# Patient Record
Sex: Female | Born: 1945 | Race: White | Hispanic: Yes | Smoking: Former smoker
Health system: Southern US, Community
[De-identification: ages and names within clinical notes are randomized; demographics above are authoritative.]

## PROBLEM LIST (undated history)

## (undated) DIAGNOSIS — G510 Bell's palsy: Secondary | ICD-10-CM

## (undated) DIAGNOSIS — R351 Nocturia: Secondary | ICD-10-CM

## (undated) DIAGNOSIS — Z860100 Personal history of colon polyps, unspecified: Secondary | ICD-10-CM

## (undated) DIAGNOSIS — H269 Unspecified cataract: Secondary | ICD-10-CM

## (undated) DIAGNOSIS — Z9889 Other specified postprocedural states: Secondary | ICD-10-CM

## (undated) DIAGNOSIS — I341 Nonrheumatic mitral (valve) prolapse: Secondary | ICD-10-CM

## (undated) DIAGNOSIS — I10 Essential (primary) hypertension: Secondary | ICD-10-CM

## (undated) DIAGNOSIS — A6920 Lyme disease, unspecified: Secondary | ICD-10-CM

## (undated) DIAGNOSIS — K219 Gastro-esophageal reflux disease without esophagitis: Secondary | ICD-10-CM

## (undated) DIAGNOSIS — R112 Nausea with vomiting, unspecified: Secondary | ICD-10-CM

## (undated) DIAGNOSIS — G4700 Insomnia, unspecified: Secondary | ICD-10-CM

## (undated) DIAGNOSIS — E785 Hyperlipidemia, unspecified: Secondary | ICD-10-CM

## (undated) DIAGNOSIS — R0982 Postnasal drip: Secondary | ICD-10-CM

## (undated) HISTORY — PX: SHOULDER ARTHROSCOPY: SHX128

## (undated) HISTORY — PX: DILATION AND CURETTAGE OF UTERUS: SHX78

## (undated) HISTORY — PX: TONSILLECTOMY: SUR1361

## (undated) HISTORY — PX: CERVICAL CONE BIOPSY: SUR198

## (undated) HISTORY — PX: COLONOSCOPY: SHX174

---
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
## 1999-01-31 ENCOUNTER — Other Ambulatory Visit: Admission: RE | Admit: 1999-01-31 | Discharge: 1999-01-31 | Payer: Self-pay | Admitting: Family Medicine

## 1999-08-05 ENCOUNTER — Encounter: Payer: Self-pay | Admitting: Family Medicine

## 1999-08-05 ENCOUNTER — Ambulatory Visit (HOSPITAL_COMMUNITY): Admission: RE | Admit: 1999-08-05 | Discharge: 1999-08-05 | Payer: Self-pay | Admitting: Family Medicine

## 1999-09-11 ENCOUNTER — Encounter (INDEPENDENT_AMBULATORY_CARE_PROVIDER_SITE_OTHER): Payer: Self-pay

## 1999-09-11 ENCOUNTER — Other Ambulatory Visit: Admission: RE | Admit: 1999-09-11 | Discharge: 1999-09-11 | Payer: Self-pay | Admitting: Obstetrics and Gynecology

## 2007-02-15 ENCOUNTER — Other Ambulatory Visit: Admission: RE | Admit: 2007-02-15 | Discharge: 2007-02-15 | Payer: Self-pay | Admitting: Family Medicine

## 2008-06-09 ENCOUNTER — Emergency Department (HOSPITAL_COMMUNITY): Admission: EM | Admit: 2008-06-09 | Discharge: 2008-06-10 | Payer: Self-pay | Admitting: Emergency Medicine

## 2009-10-08 ENCOUNTER — Encounter: Admission: RE | Admit: 2009-10-08 | Discharge: 2009-10-08 | Payer: Self-pay | Admitting: Family Medicine

## 2010-02-01 ENCOUNTER — Other Ambulatory Visit: Payer: Self-pay | Admitting: Family Medicine

## 2010-02-01 DIAGNOSIS — Z9289 Personal history of other medical treatment: Secondary | ICD-10-CM

## 2010-04-14 ENCOUNTER — Ambulatory Visit
Admission: RE | Admit: 2010-04-14 | Discharge: 2010-04-14 | Disposition: A | Discharge status: Home / Self Care | Payer: 59 | Source: Ambulatory Visit | Attending: Family Medicine | Admitting: Family Medicine

## 2010-04-14 DIAGNOSIS — Z9289 Personal history of other medical treatment: Secondary | ICD-10-CM

## 2010-04-22 LAB — POCT I-STAT, CHEM 8
BUN: 20 mg/dL (ref 6–23)
Calcium, Ion: 1.02 mmol/L — ABNORMAL LOW (ref 1.12–1.32)
Chloride: 105 mEq/L (ref 96–112)
Creatinine, Ser: 0.7 mg/dL (ref 0.4–1.2)
Glucose, Bld: 106 mg/dL — ABNORMAL HIGH (ref 70–99)
HCT: 42 % (ref 36.0–46.0)
Hemoglobin: 14.3 g/dL (ref 12.0–15.0)
Potassium: 3.4 mEq/L — ABNORMAL LOW (ref 3.5–5.1)
Sodium: 137 mEq/L (ref 135–145)
TCO2: 23 mmol/L (ref 0–100)

## 2010-05-22 ENCOUNTER — Other Ambulatory Visit (HOSPITAL_COMMUNITY)
Admission: RE | Admit: 2010-05-22 | Discharge: 2010-05-22 | Disposition: A | Discharge status: Home / Self Care | Payer: 59 | Source: Ambulatory Visit | Attending: Family Medicine | Admitting: Family Medicine

## 2010-05-22 ENCOUNTER — Other Ambulatory Visit: Payer: Self-pay | Admitting: Family Medicine

## 2010-05-22 DIAGNOSIS — Z124 Encounter for screening for malignant neoplasm of cervix: Secondary | ICD-10-CM | POA: Insufficient documentation

## 2011-04-10 ENCOUNTER — Other Ambulatory Visit: Payer: Self-pay | Admitting: Family Medicine

## 2011-04-10 DIAGNOSIS — Z1231 Encounter for screening mammogram for malignant neoplasm of breast: Secondary | ICD-10-CM

## 2011-04-17 ENCOUNTER — Ambulatory Visit
Admission: RE | Admit: 2011-04-17 | Discharge: 2011-04-17 | Disposition: A | Discharge status: Home / Self Care | Payer: 59 | Source: Ambulatory Visit | Attending: Family Medicine | Admitting: Family Medicine

## 2011-04-17 DIAGNOSIS — Z1231 Encounter for screening mammogram for malignant neoplasm of breast: Secondary | ICD-10-CM

## 2012-04-22 ENCOUNTER — Other Ambulatory Visit: Payer: Self-pay

## 2012-04-22 ENCOUNTER — Other Ambulatory Visit: Payer: Self-pay | Admitting: Family Medicine

## 2012-04-22 DIAGNOSIS — M8580 Other specified disorders of bone density and structure, unspecified site: Secondary | ICD-10-CM

## 2012-04-22 DIAGNOSIS — Z1231 Encounter for screening mammogram for malignant neoplasm of breast: Secondary | ICD-10-CM

## 2012-05-25 ENCOUNTER — Ambulatory Visit
Admission: RE | Admit: 2012-05-25 | Discharge: 2012-05-25 | Disposition: A | Discharge status: Home / Self Care | Payer: Medicare Other | Source: Ambulatory Visit | Attending: Family Medicine | Admitting: Family Medicine

## 2012-05-25 ENCOUNTER — Ambulatory Visit
Admission: RE | Admit: 2012-05-25 | Discharge: 2012-05-25 | Disposition: A | Discharge status: Home / Self Care | Payer: Medicare Other | Source: Ambulatory Visit

## 2012-05-25 DIAGNOSIS — M8580 Other specified disorders of bone density and structure, unspecified site: Secondary | ICD-10-CM

## 2012-05-25 DIAGNOSIS — Z1231 Encounter for screening mammogram for malignant neoplasm of breast: Secondary | ICD-10-CM

## 2013-05-11 ENCOUNTER — Other Ambulatory Visit: Payer: Self-pay

## 2013-05-11 DIAGNOSIS — Z1231 Encounter for screening mammogram for malignant neoplasm of breast: Secondary | ICD-10-CM

## 2013-05-31 ENCOUNTER — Encounter (INDEPENDENT_AMBULATORY_CARE_PROVIDER_SITE_OTHER): Payer: Self-pay

## 2013-05-31 ENCOUNTER — Ambulatory Visit
Admission: RE | Admit: 2013-05-31 | Discharge: 2013-05-31 | Disposition: A | Discharge status: Home / Self Care | Payer: Commercial Managed Care - HMO | Source: Ambulatory Visit

## 2013-05-31 DIAGNOSIS — Z1231 Encounter for screening mammogram for malignant neoplasm of breast: Secondary | ICD-10-CM

## 2013-06-06 ENCOUNTER — Other Ambulatory Visit: Payer: Self-pay | Admitting: Gastroenterology

## 2013-09-06 ENCOUNTER — Other Ambulatory Visit (HOSPITAL_COMMUNITY)
Admission: RE | Admit: 2013-09-06 | Discharge: 2013-09-06 | Disposition: A | Discharge status: Home / Self Care | Payer: Medicare PPO | Source: Ambulatory Visit | Attending: Family Medicine | Admitting: Family Medicine

## 2013-09-06 ENCOUNTER — Other Ambulatory Visit: Payer: Self-pay | Admitting: Family Medicine

## 2013-09-06 DIAGNOSIS — Z124 Encounter for screening for malignant neoplasm of cervix: Secondary | ICD-10-CM | POA: Diagnosis present

## 2013-09-07 LAB — CYTOLOGY - PAP

## 2014-04-24 DIAGNOSIS — H00022 Hordeolum internum right lower eyelid: Secondary | ICD-10-CM | POA: Diagnosis not present

## 2014-05-16 DIAGNOSIS — H35033 Hypertensive retinopathy, bilateral: Secondary | ICD-10-CM | POA: Diagnosis not present

## 2014-05-16 DIAGNOSIS — H524 Presbyopia: Secondary | ICD-10-CM | POA: Diagnosis not present

## 2014-05-16 DIAGNOSIS — H5210 Myopia, unspecified eye: Secondary | ICD-10-CM | POA: Diagnosis not present

## 2014-06-05 ENCOUNTER — Other Ambulatory Visit: Payer: Self-pay

## 2014-06-05 DIAGNOSIS — Z1231 Encounter for screening mammogram for malignant neoplasm of breast: Secondary | ICD-10-CM

## 2014-07-03 ENCOUNTER — Ambulatory Visit
Admission: RE | Admit: 2014-07-03 | Discharge: 2014-07-03 | Disposition: A | Discharge status: Home / Self Care | Payer: Commercial Managed Care - HMO | Source: Ambulatory Visit

## 2014-07-03 ENCOUNTER — Encounter (INDEPENDENT_AMBULATORY_CARE_PROVIDER_SITE_OTHER): Payer: Self-pay

## 2014-07-03 DIAGNOSIS — Z1231 Encounter for screening mammogram for malignant neoplasm of breast: Secondary | ICD-10-CM

## 2014-09-10 DIAGNOSIS — I10 Essential (primary) hypertension: Secondary | ICD-10-CM | POA: Diagnosis not present

## 2014-09-10 DIAGNOSIS — Z23 Encounter for immunization: Secondary | ICD-10-CM | POA: Diagnosis not present

## 2014-09-10 DIAGNOSIS — K219 Gastro-esophageal reflux disease without esophagitis: Secondary | ICD-10-CM | POA: Diagnosis not present

## 2014-09-10 DIAGNOSIS — Z0000 Encounter for general adult medical examination without abnormal findings: Secondary | ICD-10-CM | POA: Diagnosis not present

## 2014-09-10 DIAGNOSIS — R7301 Impaired fasting glucose: Secondary | ICD-10-CM | POA: Diagnosis not present

## 2014-09-10 DIAGNOSIS — E782 Mixed hyperlipidemia: Secondary | ICD-10-CM | POA: Diagnosis not present

## 2014-09-10 DIAGNOSIS — E559 Vitamin D deficiency, unspecified: Secondary | ICD-10-CM | POA: Diagnosis not present

## 2014-09-10 DIAGNOSIS — J309 Allergic rhinitis, unspecified: Secondary | ICD-10-CM | POA: Diagnosis not present

## 2014-10-17 DIAGNOSIS — R7301 Impaired fasting glucose: Secondary | ICD-10-CM | POA: Diagnosis not present

## 2014-10-17 DIAGNOSIS — J309 Allergic rhinitis, unspecified: Secondary | ICD-10-CM | POA: Diagnosis not present

## 2014-10-17 DIAGNOSIS — K219 Gastro-esophageal reflux disease without esophagitis: Secondary | ICD-10-CM | POA: Diagnosis not present

## 2014-10-17 DIAGNOSIS — M85852 Other specified disorders of bone density and structure, left thigh: Secondary | ICD-10-CM | POA: Diagnosis not present

## 2014-10-17 DIAGNOSIS — E782 Mixed hyperlipidemia: Secondary | ICD-10-CM | POA: Diagnosis not present

## 2014-10-17 DIAGNOSIS — Z23 Encounter for immunization: Secondary | ICD-10-CM | POA: Diagnosis not present

## 2014-10-17 DIAGNOSIS — I10 Essential (primary) hypertension: Secondary | ICD-10-CM | POA: Diagnosis not present

## 2014-11-03 ENCOUNTER — Other Ambulatory Visit: Payer: Self-pay | Admitting: Family Medicine

## 2014-11-03 DIAGNOSIS — M8580 Other specified disorders of bone density and structure, unspecified site: Secondary | ICD-10-CM

## 2014-11-16 DIAGNOSIS — S0501XA Injury of conjunctiva and corneal abrasion without foreign body, right eye, initial encounter: Secondary | ICD-10-CM | POA: Diagnosis not present

## 2014-12-05 ENCOUNTER — Inpatient Hospital Stay: Admission: RE | Admit: 2014-12-05 | Payer: Commercial Managed Care - HMO | Source: Ambulatory Visit

## 2014-12-18 DIAGNOSIS — H34832 Tributary (branch) retinal vein occlusion, left eye, with macular edema: Secondary | ICD-10-CM | POA: Diagnosis not present

## 2014-12-20 ENCOUNTER — Encounter (INDEPENDENT_AMBULATORY_CARE_PROVIDER_SITE_OTHER): Payer: Commercial Managed Care - HMO | Admitting: Ophthalmology

## 2014-12-20 DIAGNOSIS — I10 Essential (primary) hypertension: Secondary | ICD-10-CM | POA: Diagnosis not present

## 2014-12-20 DIAGNOSIS — H4312 Vitreous hemorrhage, left eye: Secondary | ICD-10-CM | POA: Diagnosis not present

## 2014-12-20 DIAGNOSIS — H2513 Age-related nuclear cataract, bilateral: Secondary | ICD-10-CM

## 2014-12-20 DIAGNOSIS — H35033 Hypertensive retinopathy, bilateral: Secondary | ICD-10-CM

## 2014-12-20 DIAGNOSIS — H43813 Vitreous degeneration, bilateral: Secondary | ICD-10-CM

## 2014-12-20 NOTE — H&P (Signed)
Alicia [name] is an 69 y.o. female.   Chief Complaint: Sudden loss of vision left eye HPI: Vitreous hemorrhage, hypertension.  No past medical history on file.  No past surgical history on file.  No family history on file. Social History:  has no tobacco, alcohol, and drug history on file.  Allergies: Not on File  No prescriptions prior to admission    Review of systems otherwise negative  There were no vitals taken for this visit.  Physical exam: Mental status: oriented x3. Eyes: See eye exam associated with this date of surgery in media tab.  Scanned in by scanning center Ears, Nose, Throat: within normal limits Neck: Within Normal limits General: within normal limits Chest: Within normal limits Breast: deferred Heart: Within normal limits Abdomen: Within normal limits GU: deferred Extremities: within normal limits Skin: within normal limits  Assessment/Plan Vitreous hemorrhage left eye [facility]a [facility], laser, gas left eye  Possible scleral buckle.  Sherrie GeorgeMATTHEWS, Adrine Hayworth D 12/20/2014, 4:12 PM

## 2014-12-24 ENCOUNTER — Encounter (HOSPITAL_COMMUNITY): Payer: Self-pay | Admitting: *Deleted

## 2014-12-24 DIAGNOSIS — H3562 Retinal hemorrhage, left eye: Secondary | ICD-10-CM | POA: Diagnosis not present

## 2014-12-24 DIAGNOSIS — I10 Essential (primary) hypertension: Secondary | ICD-10-CM | POA: Diagnosis not present

## 2014-12-24 MED ORDER — CEFAZOLIN SODIUM-DEXTROSE 2-3 GM-% IV SOLR
2.0000 g | INTRAVENOUS | Status: AC
  Administered 2014-12-25: 2 g via INTRAVENOUS
  Filled 2014-12-24: qty 50

## 2014-12-25 ENCOUNTER — Ambulatory Visit (HOSPITAL_COMMUNITY)
Admission: RE | Admit: 2014-12-25 | Discharge: 2014-12-26 | Disposition: A | Discharge status: Home / Self Care | Payer: Commercial Managed Care - HMO | Source: Ambulatory Visit | Attending: Ophthalmology | Admitting: Ophthalmology

## 2014-12-25 ENCOUNTER — Encounter (HOSPITAL_COMMUNITY): Payer: Self-pay | Admitting: *Deleted

## 2014-12-25 ENCOUNTER — Ambulatory Visit (HOSPITAL_COMMUNITY): Payer: Commercial Managed Care - HMO | Admitting: Certified Registered Nurse Anesthetist

## 2014-12-25 ENCOUNTER — Encounter (HOSPITAL_COMMUNITY)
Admission: RE | Disposition: A | Discharge status: Home / Self Care | Payer: Self-pay | Source: Ambulatory Visit | Attending: Ophthalmology

## 2014-12-25 DIAGNOSIS — Z881 Allergy status to other antibiotic agents status: Secondary | ICD-10-CM | POA: Diagnosis not present

## 2014-12-25 DIAGNOSIS — K219 Gastro-esophageal reflux disease without esophagitis: Secondary | ICD-10-CM | POA: Insufficient documentation

## 2014-12-25 DIAGNOSIS — H4312 Vitreous hemorrhage, left eye: Secondary | ICD-10-CM | POA: Diagnosis present

## 2014-12-25 DIAGNOSIS — Z885 Allergy status to narcotic agent status: Secondary | ICD-10-CM | POA: Diagnosis not present

## 2014-12-25 DIAGNOSIS — I10 Essential (primary) hypertension: Secondary | ICD-10-CM | POA: Diagnosis not present

## 2014-12-25 DIAGNOSIS — Z87891 Personal history of nicotine dependence: Secondary | ICD-10-CM | POA: Diagnosis not present

## 2014-12-25 HISTORY — DX: Nonrheumatic mitral (valve) prolapse: I34.1

## 2014-12-25 HISTORY — DX: Postnasal drip: R09.82

## 2014-12-25 HISTORY — DX: Unspecified cataract: H26.9

## 2014-12-25 HISTORY — DX: Bell's palsy: G51.0

## 2014-12-25 HISTORY — DX: Essential (primary) hypertension: I10

## 2014-12-25 HISTORY — PX: PARS PLANA VITRECTOMY: SHX2166

## 2014-12-25 HISTORY — DX: Lyme disease, unspecified: A69.20

## 2014-12-25 HISTORY — DX: Other specified postprocedural states: Z98.890

## 2014-12-25 HISTORY — PX: GAS/FLUID EXCHANGE: SHX5334

## 2014-12-25 HISTORY — DX: Insomnia, unspecified: G47.00

## 2014-12-25 HISTORY — DX: Personal history of colon polyps, unspecified: Z86.0100

## 2014-12-25 HISTORY — DX: Nausea with vomiting, unspecified: R11.2

## 2014-12-25 HISTORY — DX: Nocturia: R35.1

## 2014-12-25 HISTORY — DX: Hyperlipidemia, unspecified: E78.5

## 2014-12-25 HISTORY — DX: Personal history of colonic polyps: Z86.010

## 2014-12-25 HISTORY — DX: Gastro-esophageal reflux disease without esophagitis: K21.9

## 2014-12-25 LAB — BASIC METABOLIC PANEL
Anion gap: 9 (ref 5–15)
BUN: 23 mg/dL — AB (ref 6–20)
CALCIUM: 9.6 mg/dL (ref 8.9–10.3)
CO2: 26 mmol/L (ref 22–32)
Chloride: 102 mmol/L (ref 101–111)
Creatinine, Ser: 0.8 mg/dL (ref 0.44–1.00)
GFR calc Af Amer: >60 mL/min (ref >60)
Glucose, Bld: 114 mg/dL — ABNORMAL HIGH (ref 65–99)
Potassium: 4.2 mmol/L (ref 3.5–5.1)
Sodium: 137 mmol/L (ref 135–145)

## 2014-12-25 LAB — CBC
HCT: 36.6 % (ref 36.0–46.0)
Hemoglobin: 11.9 g/dL — ABNORMAL LOW (ref 12.0–15.0)
MCH: 31.1 pg (ref 26.0–34.0)
MCHC: 32.5 g/dL (ref 30.0–36.0)
MCV: 95.6 fL (ref 78.0–100.0)
PLATELETS: 250 10*3/uL (ref 150–400)
RBC: 3.83 MIL/uL — ABNORMAL LOW (ref 3.87–5.11)
RDW: 13.1 % (ref 11.5–15.5)
WBC: 4.4 10*3/uL (ref 4.0–10.5)

## 2014-12-25 SURGERY — PARS PLANA VITRECTOMY WITH 25 GAUGE
Anesthesia: General | Site: Eye | Laterality: Left

## 2014-12-25 MED ORDER — ONDANSETRON HCL 4 MG/2ML IJ SOLN
4.0000 mg | Freq: Four times a day (QID) | INTRAMUSCULAR | Status: DC
  Administered 2014-12-25 – 2014-12-26 (×2): 4 mg via INTRAVENOUS
  Filled 2014-12-25 (×2): qty 2

## 2014-12-25 MED ORDER — HYDROCODONE-ACETAMINOPHEN 5-325 MG PO TABS: 1.0000 | ORAL_TABLET | ORAL | Status: DC | PRN

## 2014-12-25 MED ORDER — BRIMONIDINE TARTRATE 0.2 % OP SOLN
1.0000 [drp] | Freq: Two times a day (BID) | OPHTHALMIC | Status: DC
  Filled 2014-12-25: qty 5

## 2014-12-25 MED ORDER — HEMOSTATIC AGENTS (NO CHARGE) OPTIME
TOPICAL | Status: DC | PRN
  Administered 2014-12-25: 1 via TOPICAL

## 2014-12-25 MED ORDER — MIDAZOLAM HCL 2 MG/2ML IJ SOLN
INTRAMUSCULAR | Status: AC
  Filled 2014-12-25: qty 2

## 2014-12-25 MED ORDER — HYDROMORPHONE HCL 1 MG/ML IJ SOLN
0.2500 mg | INTRAMUSCULAR | Status: DC | PRN
  Administered 2014-12-25 (×2): 0.5 mg via INTRAVENOUS

## 2014-12-25 MED ORDER — PREDNISOLONE ACETATE 1 % OP SUSP
1.0000 [drp] | Freq: Four times a day (QID) | OPHTHALMIC | Status: DC
  Filled 2014-12-25: qty 5

## 2014-12-25 MED ORDER — PHENYLEPHRINE HCL 10 MG/ML IJ SOLN
INTRAMUSCULAR | Status: DC | PRN
  Administered 2014-12-25 (×4): 40 ug via INTRAVENOUS

## 2014-12-25 MED ORDER — HYDROCHLOROTHIAZIDE 12.5 MG PO CAPS: 12.5000 mg | ORAL_CAPSULE | Freq: Every day | ORAL | Status: DC

## 2014-12-25 MED ORDER — TETRACAINE HCL 0.5 % OP SOLN
2.0000 [drp] | Freq: Once | OPHTHALMIC | Status: DC
  Filled 2014-12-25: qty 2

## 2014-12-25 MED ORDER — TEMAZEPAM 15 MG PO CAPS
15.0000 mg | ORAL_CAPSULE | Freq: Every evening | ORAL | Status: DC | PRN
  Administered 2014-12-26: 15 mg via ORAL
  Filled 2014-12-25: qty 1

## 2014-12-25 MED ORDER — PHENYLEPHRINE HCL 10 MG/ML IJ SOLN
10.0000 mg | INTRAVENOUS | Status: DC | PRN
  Administered 2014-12-25: 25 ug/min via INTRAVENOUS

## 2014-12-25 MED ORDER — SODIUM CHLORIDE 0.9 % IJ SOLN
INTRAMUSCULAR | Status: AC
  Filled 2014-12-25: qty 10

## 2014-12-25 MED ORDER — HYDROMORPHONE HCL 1 MG/ML IJ SOLN
INTRAMUSCULAR | Status: AC
  Filled 2014-12-25: qty 1

## 2014-12-25 MED ORDER — EPINEPHRINE HCL 1 MG/ML IJ SOLN
INTRAOCULAR | Status: DC | PRN
  Administered 2014-12-25: 13:00:00

## 2014-12-25 MED ORDER — SODIUM CHLORIDE 0.45 % IV SOLN
INTRAVENOUS | Status: DC
  Administered 2014-12-25: 19:00:00 via INTRAVENOUS

## 2014-12-25 MED ORDER — BUPIVACAINE HCL (PF) 0.75 % IJ SOLN
INTRAMUSCULAR | Status: AC
  Filled 2014-12-25: qty 10

## 2014-12-25 MED ORDER — DEXAMETHASONE SODIUM PHOSPHATE 10 MG/ML IJ SOLN
INTRAMUSCULAR | Status: AC
  Filled 2014-12-25: qty 1

## 2014-12-25 MED ORDER — HYPROMELLOSE (GONIOSCOPIC) 2.5 % OP SOLN
OPHTHALMIC | Status: AC
  Filled 2014-12-25: qty 15

## 2014-12-25 MED ORDER — IBUPROFEN 400 MG PO TABS
400.0000 mg | ORAL_TABLET | Freq: Four times a day (QID) | ORAL | Status: DC | PRN
  Filled 2014-12-25: qty 1

## 2014-12-25 MED ORDER — SODIUM CHLORIDE 0.9 % IV SOLN
INTRAVENOUS | Status: DC
  Administered 2014-12-25 (×2): via INTRAVENOUS

## 2014-12-25 MED ORDER — BSS IO SOLN
INTRAOCULAR | Status: AC
  Filled 2014-12-25: qty 15

## 2014-12-25 MED ORDER — LISINOPRIL 20 MG PO TABS: 20.0000 mg | ORAL_TABLET | Freq: Every day | ORAL | Status: DC

## 2014-12-25 MED ORDER — SODIUM HYALURONATE 10 MG/ML IO SOLN
INTRAOCULAR | Status: AC
  Filled 2014-12-25: qty 0.85

## 2014-12-25 MED ORDER — BACITRACIN-POLYMYXIN B 500-10000 UNIT/GM OP OINT
TOPICAL_OINTMENT | OPHTHALMIC | Status: AC
  Filled 2014-12-25: qty 3.5

## 2014-12-25 MED ORDER — GATIFLOXACIN 0.5 % OP SOLN
1.0000 [drp] | OPHTHALMIC | Status: AC | PRN
  Administered 2014-12-25 (×3): 1 [drp] via OPHTHALMIC
  Filled 2014-12-25: qty 2.5

## 2014-12-25 MED ORDER — ACETAZOLAMIDE SODIUM 500 MG IJ SOLR
500.0000 mg | Freq: Once | INTRAMUSCULAR | Status: AC
  Administered 2014-12-26: 500 mg via INTRAVENOUS
  Filled 2014-12-25: qty 500

## 2014-12-25 MED ORDER — CYCLOPENTOLATE HCL 1 % OP SOLN
1.0000 [drp] | OPHTHALMIC | Status: AC | PRN
  Administered 2014-12-25 (×3): 1 [drp] via OPHTHALMIC
  Filled 2014-12-25: qty 2

## 2014-12-25 MED ORDER — ROCURONIUM BROMIDE 100 MG/10ML IV SOLN
INTRAVENOUS | Status: DC | PRN
  Administered 2014-12-25: 40 mg via INTRAVENOUS

## 2014-12-25 MED ORDER — FENTANYL CITRATE (PF) 100 MCG/2ML IJ SOLN
INTRAMUSCULAR | Status: DC | PRN
  Administered 2014-12-25: 50 ug via INTRAVENOUS

## 2014-12-25 MED ORDER — FENTANYL CITRATE (PF) 250 MCG/5ML IJ SOLN
INTRAMUSCULAR | Status: AC
  Filled 2014-12-25: qty 5

## 2014-12-25 MED ORDER — PROMETHAZINE HCL 25 MG/ML IJ SOLN
12.5000 mg | Freq: Four times a day (QID) | INTRAMUSCULAR | Status: DC | PRN
  Filled 2014-12-25: qty 1

## 2014-12-25 MED ORDER — EPINEPHRINE HCL 1 MG/ML IJ SOLN
INTRAMUSCULAR | Status: AC
  Filled 2014-12-25: qty 1

## 2014-12-25 MED ORDER — DORZOLAMIDE HCL-TIMOLOL MAL 2-0.5 % OP SOLN
1.0000 [drp] | Freq: Once | OPHTHALMIC | Status: AC
  Administered 2014-12-25: 1 [drp] via OPHTHALMIC
  Filled 2014-12-25: qty 10

## 2014-12-25 MED ORDER — DORZOLAMIDE HCL-TIMOLOL MAL 2-0.5 % OP SOLN: 1.0000 [drp] | Freq: Two times a day (BID) | OPHTHALMIC | Status: DC

## 2014-12-25 MED ORDER — MEPERIDINE HCL 25 MG/ML IJ SOLN: 6.2500 mg | INTRAMUSCULAR | Status: DC | PRN

## 2014-12-25 MED ORDER — PROPOFOL 10 MG/ML IV BOLUS
INTRAVENOUS | Status: DC | PRN
  Administered 2014-12-25: 100 mg via INTRAVENOUS

## 2014-12-25 MED ORDER — BACITRACIN-POLYMYXIN B 500-10000 UNIT/GM OP OINT
1.0000 "application " | TOPICAL_OINTMENT | Freq: Three times a day (TID) | OPHTHALMIC | Status: DC
  Filled 2014-12-25: qty 3.5

## 2014-12-25 MED ORDER — FLUTICASONE PROPIONATE 50 MCG/ACT NA SUSP
1.0000 | Freq: Every day | NASAL | Status: DC | PRN
  Filled 2014-12-25: qty 16

## 2014-12-25 MED ORDER — GATIFLOXACIN 0.5 % OP SOLN
1.0000 [drp] | Freq: Four times a day (QID) | OPHTHALMIC | Status: DC
  Filled 2014-12-25: qty 2.5

## 2014-12-25 MED ORDER — ACETAMINOPHEN 325 MG PO TABS: 325.0000 mg | ORAL_TABLET | ORAL | Status: DC | PRN

## 2014-12-25 MED ORDER — ONDANSETRON HCL 4 MG/2ML IJ SOLN
INTRAMUSCULAR | Status: DC | PRN
  Administered 2014-12-25: 4 mg via INTRAVENOUS

## 2014-12-25 MED ORDER — STERILE WATER FOR INJECTION IJ SOLN
INTRAMUSCULAR | Status: AC
  Filled 2014-12-25: qty 20

## 2014-12-25 MED ORDER — MIDAZOLAM HCL 5 MG/5ML IJ SOLN
INTRAMUSCULAR | Status: DC | PRN
  Administered 2014-12-25: 1 mg via INTRAVENOUS

## 2014-12-25 MED ORDER — BSS PLUS IO SOLN
INTRAOCULAR | Status: AC
  Filled 2014-12-25: qty 500

## 2014-12-25 MED ORDER — LATANOPROST 0.005 % OP SOLN
1.0000 [drp] | Freq: Every day | OPHTHALMIC | Status: DC
  Filled 2014-12-25: qty 2.5

## 2014-12-25 MED ORDER — POLYMYXIN B SULFATE 500000 UNITS IJ SOLR
INTRAMUSCULAR | Status: AC
  Filled 2014-12-25: qty 1

## 2014-12-25 MED ORDER — 0.9 % SODIUM CHLORIDE (POUR BTL) OPTIME
TOPICAL | Status: DC | PRN
Start: 2014-12-25 — End: 2014-12-25
  Administered 2014-12-25: 200 mL

## 2014-12-25 MED ORDER — SODIUM HYALURONATE 10 MG/ML IO SOLN
INTRAOCULAR | Status: DC | PRN
  Administered 2014-12-25: 0.85 mL via INTRAOCULAR

## 2014-12-25 MED ORDER — HYALURONIDASE HUMAN 150 UNIT/ML IJ SOLN
INTRAMUSCULAR | Status: AC
  Filled 2014-12-25: qty 1

## 2014-12-25 MED ORDER — PANTOPRAZOLE SODIUM 40 MG PO TBEC: 40.0000 mg | DELAYED_RELEASE_TABLET | Freq: Every day | ORAL | Status: DC

## 2014-12-25 MED ORDER — STERILE WATER FOR INJECTION IJ SOLN
INTRAMUSCULAR | Status: DC | PRN
  Administered 2014-12-25: 20 mL

## 2014-12-25 MED ORDER — MAGNESIUM HYDROXIDE 400 MG/5ML PO SUSP: 15.0000 mL | Freq: Four times a day (QID) | ORAL | Status: DC | PRN

## 2014-12-25 MED ORDER — CEFTAZIDIME 1 G IJ SOLR
INTRAMUSCULAR | Status: AC
  Filled 2014-12-25: qty 1

## 2014-12-25 MED ORDER — GLYCOPYRROLATE 0.2 MG/ML IJ SOLN
INTRAMUSCULAR | Status: DC | PRN
  Administered 2014-12-25: 0.4 mg via INTRAVENOUS

## 2014-12-25 MED ORDER — PHENYLEPHRINE HCL 2.5 % OP SOLN
1.0000 [drp] | OPHTHALMIC | Status: AC | PRN
Start: 2014-12-25 — End: 2014-12-25
  Administered 2014-12-25 (×3): 1 [drp] via OPHTHALMIC
  Filled 2014-12-25: qty 2

## 2014-12-25 MED ORDER — DEXAMETHASONE SODIUM PHOSPHATE 10 MG/ML IJ SOLN
INTRAMUSCULAR | Status: DC | PRN
  Administered 2014-12-25: 10 mg

## 2014-12-25 MED ORDER — NEOSTIGMINE METHYLSULFATE 10 MG/10ML IV SOLN
INTRAVENOUS | Status: DC | PRN
  Administered 2014-12-25: 3 mg via INTRAVENOUS

## 2014-12-25 MED ORDER — BACITRACIN-POLYMYXIN B 500-10000 UNIT/GM OP OINT
TOPICAL_OINTMENT | OPHTHALMIC | Status: DC | PRN
  Administered 2014-12-25: 1 via OPHTHALMIC

## 2014-12-25 MED ORDER — MORPHINE SULFATE (PF) 2 MG/ML IV SOLN: 1.0000 mg | INTRAVENOUS | Status: DC | PRN

## 2014-12-25 MED ORDER — SODIUM CHLORIDE 0.9 % IV SOLN: INTRAVENOUS | Status: DC

## 2014-12-25 MED ORDER — LISINOPRIL-HYDROCHLOROTHIAZIDE 20-12.5 MG PO TABS: 1.0000 | ORAL_TABLET | Freq: Every day | ORAL | Status: DC

## 2014-12-25 MED ORDER — BUPIVACAINE HCL (PF) 0.75 % IJ SOLN
INTRAMUSCULAR | Status: DC | PRN
  Administered 2014-12-25: 10 mL

## 2014-12-25 MED ORDER — DILTIAZEM HCL ER COATED BEADS 180 MG PO CP24: 180.0000 mg | ORAL_CAPSULE | Freq: Every day | ORAL | Status: DC

## 2014-12-25 MED ORDER — TROPICAMIDE 1 % OP SOLN
1.0000 [drp] | OPHTHALMIC | Status: AC | PRN
  Administered 2014-12-25 (×3): 1 [drp] via OPHTHALMIC
  Filled 2014-12-25: qty 3

## 2014-12-25 MED ORDER — STERILE WATER FOR IRRIGATION IR SOLN
Status: DC | PRN
  Administered 2014-12-25: 200 mL

## 2014-12-25 MED ORDER — ATROPINE SULFATE 1 % OP SOLN
OPHTHALMIC | Status: AC
  Filled 2014-12-25: qty 5

## 2014-12-25 MED ORDER — ARTIFICIAL TEARS OP OINT
TOPICAL_OINTMENT | OPHTHALMIC | Status: DC | PRN
  Administered 2014-12-25: 1 via OPHTHALMIC

## 2014-12-25 MED ORDER — ONDANSETRON HCL 4 MG/2ML IJ SOLN
4.0000 mg | Freq: Once | INTRAMUSCULAR | Status: AC | PRN
  Administered 2014-12-25: 4 mg via INTRAVENOUS

## 2014-12-25 MED ORDER — PROPOFOL 10 MG/ML IV BOLUS
INTRAVENOUS | Status: AC
  Filled 2014-12-25: qty 20

## 2014-12-25 MED ORDER — ONDANSETRON HCL 4 MG/2ML IJ SOLN
INTRAMUSCULAR | Status: AC
  Filled 2014-12-25: qty 2

## 2014-12-25 MED ORDER — TRIAMCINOLONE ACETONIDE 40 MG/ML IJ SUSP
INTRAMUSCULAR | Status: AC
  Filled 2014-12-25: qty 5

## 2014-12-25 SURGICAL SUPPLY — 64 items
APPLICATOR DR MATTHEWS STRL (MISCELLANEOUS) IMPLANT
BLADE EYE CATARACT 19 1.4 BEAV (BLADE) IMPLANT
BLADE MVR KNIFE 19G (BLADE) IMPLANT
BLADE MVR KNIFE 20G (BLADE) IMPLANT
CANNULA DUAL BORE 23G (CANNULA) IMPLANT
CANNULA VLV SOFT TIP 25GA (OPHTHALMIC) ×2 IMPLANT
CORDS BIPOLAR (ELECTRODE) IMPLANT
COTTONBALL LRG STERILE PKG (GAUZE/BANDAGES/DRESSINGS) ×6 IMPLANT
COVER MAYO STAND STRL (DRAPES) IMPLANT
DRAPE INCISE 51X51 W/FILM STRL (DRAPES) IMPLANT
DRAPE OPHTHALMIC 77X100 STRL (CUSTOM PROCEDURE TRAY) ×2 IMPLANT
FILTER BLUE MILLIPORE (MISCELLANEOUS) IMPLANT
FILTER STRAW FLUID ASPIR (MISCELLANEOUS) IMPLANT
FORCEPS ECKARDT ILM 25G SERR (OPHTHALMIC RELATED) IMPLANT
FORCEPS GRIESHABER ILM 25G A (INSTRUMENTS) IMPLANT
GLOVE SS BIOGEL STRL SZ 6.5 (GLOVE) ×1 IMPLANT
GLOVE SS BIOGEL STRL SZ 7 (GLOVE) ×1 IMPLANT
GLOVE SUPERSENSE BIOGEL SZ 6.5 (GLOVE) ×1
GLOVE SUPERSENSE BIOGEL SZ 7 (GLOVE) ×1
GLOVE SURG 8.5 LATEX PF (GLOVE) ×2 IMPLANT
GLOVE SURG SS PI 6.5 STRL IVOR (GLOVE) ×2 IMPLANT
GOWN STRL REUS W/ TWL LRG LVL3 (GOWN DISPOSABLE) ×3 IMPLANT
GOWN STRL REUS W/TWL LRG LVL3 (GOWN DISPOSABLE) ×3
HANDLE PNEUMATIC FOR CONSTEL (OPHTHALMIC) IMPLANT
KIT BASIN OR (CUSTOM PROCEDURE TRAY) ×2 IMPLANT
KNIFE CRESCENT 2.5 55 ANG (BLADE) IMPLANT
MICROPICK 25G (MISCELLANEOUS)
NEEDLE 18GX1X1/2 (RX/OR ONLY) (NEEDLE) ×2 IMPLANT
NEEDLE 25GX 5/8IN NON SAFETY (NEEDLE) ×2 IMPLANT
NEEDLE 27GAX1X1/2 (NEEDLE) IMPLANT
NEEDLE FILTER BLUNT 18X 1/2SAF (NEEDLE) ×1
NEEDLE FILTER BLUNT 18X1 1/2 (NEEDLE) ×1 IMPLANT
NEEDLE HYPO 30X.5 LL (NEEDLE) IMPLANT
NS IRRIG 1000ML POUR BTL (IV SOLUTION) ×2 IMPLANT
PACK VITRECTOMY CUSTOM (CUSTOM PROCEDURE TRAY) ×2 IMPLANT
PAD ARMBOARD 7.5X6 YLW CONV (MISCELLANEOUS) ×2 IMPLANT
PAK PIK VITRECTOMY CVS 25GA (OPHTHALMIC) ×2 IMPLANT
PENCIL BIPOLAR 25GA STR DISP (OPHTHALMIC RELATED) IMPLANT
PIC ILLUMINATED 25G (OPHTHALMIC) ×2
PICK MICROPICK 25G (MISCELLANEOUS) IMPLANT
PIK ILLUMINATED 25G (OPHTHALMIC) ×1 IMPLANT
PROBE LASER ILLUM FLEX CVD 25G (OPHTHALMIC) ×2 IMPLANT
REPL STRA BRUSH NEEDLE (NEEDLE) IMPLANT
RESERVOIR BACK FLUSH (MISCELLANEOUS) IMPLANT
ROLLS DENTAL (MISCELLANEOUS) ×4 IMPLANT
SCISSORS TIP ADVANCED DSP 25GA (INSTRUMENTS) IMPLANT
SCRAPER DIAMOND 25GA (OPHTHALMIC RELATED) IMPLANT
SCRAPER DIAMOND DUST MEMBRANE (MISCELLANEOUS) IMPLANT
SPONGE SURGIFOAM ABS GEL 12-7 (HEMOSTASIS) ×2 IMPLANT
STOPCOCK 4 WAY LG BORE MALE ST (IV SETS) IMPLANT
SUT CHROMIC 7 0 TG140 8 (SUTURE) IMPLANT
SUT ETHILON 10 0 CS140 6 (SUTURE) IMPLANT
SUT ETHILON 9 0 TG140 8 (SUTURE) IMPLANT
SUT POLY NON ABSORB 10-0 8 STR (SUTURE) IMPLANT
SUT SILK 4 0 RB 1 (SUTURE) IMPLANT
SYR 20CC LL (SYRINGE) ×2 IMPLANT
SYR 5ML LL (SYRINGE) IMPLANT
SYR BULB 3OZ (MISCELLANEOUS) ×2 IMPLANT
SYR TB 1ML LUER SLIP (SYRINGE) ×2 IMPLANT
SYRINGE 10CC LL (SYRINGE) IMPLANT
TOWEL OR 17X24 6PK STRL BLUE (TOWEL DISPOSABLE) ×2 IMPLANT
TUBING HIGH PRESS EXTEN 6IN (TUBING) IMPLANT
WATER STERILE IRR 1000ML POUR (IV SOLUTION) ×2 IMPLANT
WIPE INSTRUMENT VISIWIPE 73X73 (MISCELLANEOUS) IMPLANT

## 2014-12-25 NOTE — Anesthesia Preprocedure Evaluation (Signed)
Anesthesia Evaluation  Patient identified by MRN, date of birth, ID band Patient awake    Reviewed: Allergy & Precautions, NPO status , Patient's Chart, lab work & pertinent test results  History of Anesthesia Complications (+) PONV  Airway Mallampati: I  TM Distance: >3 FB Neck ROM: Full    Dental   Pulmonary former smoker,    Pulmonary exam normal        Cardiovascular hypertension, Pt. on medications Normal cardiovascular exam     Neuro/Psych    GI/Hepatic GERD  Medicated and Controlled,  Endo/Other    Renal/GU      Musculoskeletal   Abdominal   Peds  Hematology   Anesthesia Other Findings   Reproductive/Obstetrics                             Anesthesia Physical Anesthesia Plan  ASA: II  Anesthesia Plan: General   Post-op Pain Management:    Induction: Intravenous  Airway Management Planned: Oral ETT  Additional Equipment:   Intra-op Plan:   Post-operative Plan: Extubation in OR  Informed Consent: I have reviewed the patients History and Physical, chart, labs and discussed the procedure including the risks, benefits and alternatives for the proposed anesthesia with the patient or authorized representative who has indicated his/her understanding and acceptance.     Plan Discussed with: CRNA and Surgeon  Anesthesia Plan Comments:         Anesthesia Quick Evaluation

## 2014-12-25 NOTE — Anesthesia Postprocedure Evaluation (Signed)
Anesthesia Post Note  Patient: Josefa HalfMary Bohannon  Procedure(s) Performed: Procedure(s) (LRB): PARS PLANA VITRECTOMY WITH 25 GAUGE LEFT EYE;  HEADSCOPE LASER (Left) GAS/FLUID EXCHANGE LEFT EYE (Left)  Patient location during evaluation: PACU Anesthesia Type: General Level of consciousness: awake and alert Pain management: pain level controlled Vital Signs Assessment: post-procedure vital signs reviewed and stable Respiratory status: spontaneous breathing, nonlabored ventilation, respiratory function stable and patient connected to nasal cannula oxygen Cardiovascular status: blood pressure returned to baseline and stable Postop Assessment: no signs of nausea or vomiting Anesthetic complications: no    Last Vitals:  Filed Vitals:   12/25/14 1730 12/25/14 1801  BP: 129/71   Pulse: 77   Temp:  36.8 C  Resp: 13     Last Pain:  Filed Vitals:   12/25/14 1801  PainSc: 0-No pain                 Quinlee Sciarra DAVID

## 2014-12-25 NOTE — H&P (Signed)
I examined the patient today and there is no change in the medical status 

## 2014-12-25 NOTE — Transfer of Care (Signed)
Immediate Anesthesia Transfer of Care Note  Patient: Alicia [name]  Procedure(s) Performed: Procedure(s): PARS PLANA VITRECTOMY WITH 25 GAUGE LEFT EYE;  HEADSCOPE LASER (Left) GAS/FLUID EXCHANGE LEFT EYE (Left)  Patient Location: PACU  Anesthesia Type:General  Level of Consciousness: awake, alert , patient cooperative and responds to stimulation  Airway & Oxygen Therapy: Patient Spontanous Breathing and Patient connected to nasal cannula oxygen  Post-op Assessment: Report given to RN, Post -op Vital signs reviewed and stable and Patient moving all extremities X 4  Post vital signs: Reviewed and stable  Last Vitals:  Filed Vitals:   12/25/14 0924  BP: 143/70  Pulse: 93  Temp: 36.9 C  Resp: 18    Complications: No apparent anesthesia complications

## 2014-12-25 NOTE — Anesthesia Procedure Notes (Signed)
Procedure Name: Intubation Date/Time: 12/25/2014 1:49 PM Performed by: Virgel GessHOLTZMAN, Kadar Chance LEFFEW Pre-anesthesia Checklist: Patient identified, Patient being monitored, Timeout performed, Emergency Drugs available and Suction available Patient Re-evaluated:Patient Re-evaluated prior to inductionOxygen Delivery Method: Circle System Utilized Preoxygenation: Pre-oxygenation with 100% oxygen Intubation Type: IV induction Ventilation: Mask ventilation without difficulty Laryngoscope Size: Mac and 3 Grade View: Grade II Tube type: Oral Tube size: 7.0 mm Number of attempts: 1 Airway Equipment and Method: Stylet Placement Confirmation: ETT inserted through vocal cords under direct vision,  positive ETCO2 and breath sounds checked- equal and bilateral Secured at: 21 cm Tube secured with: Tape Dental Injury: Teeth and Oropharynx as per pre-operative assessment

## 2014-12-25 NOTE — Brief Op Note (Signed)
Brief Operative note   Preoperative diagnosis:  vitreous hemorhage left eye Postoperative diagnosis  * No Diagnosis Codes entered *  Procedures: Pars plana vitrectomy, laser, membrane peel, gas injection, left eye  Surgeon:  Sherrie GeorgeJohn D Matthews, MD...  Assistant:  Rosalie DoctorLisa Johnson SA   Anesthesia: General  Specimen: none  Estimated blood loss:  1cc  Complications: none  Patient sent to PACU in good condition  Composed by Sherrie GeorgeJohn D Matthews MD  Dictation number: [id number]

## 2014-12-26 ENCOUNTER — Encounter (HOSPITAL_COMMUNITY): Payer: Self-pay | Admitting: Ophthalmology

## 2014-12-26 DIAGNOSIS — K219 Gastro-esophageal reflux disease without esophagitis: Secondary | ICD-10-CM | POA: Diagnosis not present

## 2014-12-26 DIAGNOSIS — Z881 Allergy status to other antibiotic agents status: Secondary | ICD-10-CM | POA: Diagnosis not present

## 2014-12-26 DIAGNOSIS — Z87891 Personal history of nicotine dependence: Secondary | ICD-10-CM | POA: Diagnosis not present

## 2014-12-26 DIAGNOSIS — I10 Essential (primary) hypertension: Secondary | ICD-10-CM | POA: Diagnosis not present

## 2014-12-26 DIAGNOSIS — H4312 Vitreous hemorrhage, left eye: Secondary | ICD-10-CM | POA: Diagnosis not present

## 2014-12-26 DIAGNOSIS — Z885 Allergy status to narcotic agent status: Secondary | ICD-10-CM | POA: Diagnosis not present

## 2014-12-26 MED ORDER — GATIFLOXACIN 0.5 % OP SOLN: 1.0000 [drp] | Freq: Four times a day (QID) | OPHTHALMIC | Status: DC

## 2014-12-26 MED ORDER — BACITRACIN-POLYMYXIN B 500-10000 UNIT/GM OP OINT: 1.0000 "application " | TOPICAL_OINTMENT | Freq: Three times a day (TID) | OPHTHALMIC | Status: DC

## 2014-12-26 MED ORDER — PREDNISOLONE ACETATE 1 % OP SUSP: 1.0000 [drp] | Freq: Four times a day (QID) | OPHTHALMIC | Status: DC

## 2014-12-26 NOTE — Discharge Summary (Signed)
Discharge summary not needed on [facility] patients per medical records. 

## 2014-12-26 NOTE — Op Note (Signed)
NAMJosefa Half:  [name], Alicia [name]                   ACCOUNT NO.:  [id number]  MEDICAL RECORD NO.:  [id number]  LOCATION:  [id number]                        FACILITY:  [facility]  PHYSICIAN:  Beulah GandyJohn D. Ashley RoyaltyMatthews, M.D. DATE OF BIRTH:  [birth year]  DATE OF PROCEDURE:  12/25/2014 DATE OF DISCHARGE:  12/26/2014                              OPERATIVE REPORT   ADMISSION DIAGNOSIS:  Vitreous hemorrhage, left eye.  PROCEDURES: 1. Pars plana vitrectomy, left eye. 2. Gas fluid exchange, left eye. 3. Membrane peel left eye and retinal photocoagulation, left eye.  SURGEON:  Alan MulderJohn Matthews.  ASSISTANT:  Rosalie DoctorLisa Johnson SA.  ANESTHESIA:  General.  DETAILS:  Usual prep and drape, 25-gauge trocar was placed at 10, 2, and 4 o'clock;  infusion at 4 o'clock.  The pars plana vitrectomy was begun just behind these crystalline lens.  Dense red blood was encountered. This was carefully removed under low suction and rapid cutting down to the macular surface.  There was surface hemorrhage as well.  The core vitrectomy was completed removing all vitreous blood from this area. The vitrectomy is carried into the mid periphery, where a white area of myelinated nerve fibers was seen at 1 o'clock.  Blood was vacuumed from the surface of the retina with the 25-gauge cutter.  The vitrectomy was carried into the far periphery at the super wide BIOM viewing system. Provisc was placed on the corneal surface for optimum viewing.  Scleral depression was used to gain access to the vitreous base for 360 degrees. Blood was removed from these areas and vitreous was removed from the vitreous base as well.  The instruments were allowed to remain in.  The attention was carried to the peripheral retina with the indirect ophthalmoscope laser.  The red peripheral retina was inspected for breaks and no breaks were seen, 601 burns were placed around the retinal periphery and suspicious areas.  The power was 400 mW, 1000 microns each and 0.1 seconds each.   Attention was returned back to the vitreous cavity where additional vitrectomy was carried out, removing all blood, and vitreous particles.  A 30% gas fluid exchange was carried out.  The instruments were removed from the eye.  The 25-gauge trocars were removed from the eye.  The wounds were tested and found to be secured. Polymyxin and ceftazidime where rinsed around the globe for antibiotic coverage.  Marcaine was injected around the globe for postop pain.  Decadron 10 mg was injected into the lower subconjunctival space. The closing pressure was 15 with a Barraquer tonometer.  Polysporin ophthalmic ointment patch and shield were placed.  The patient was awakened, taken to recovery in satisfactory condition.     Beulah GandyJohn D. Ashley RoyaltyMatthews, M.D.     JDM/MEDQ  D:  12/25/2014  T:  12/26/2014  Job:  [id number]

## 2014-12-26 NOTE — Progress Notes (Signed)
Discussed discharge summary with patient. Reviewed all medications with patient. Patient received eye drops by MD. Patient ready for discharge.

## 2014-12-26 NOTE — Progress Notes (Signed)
12/26/2014, 6:36 AM  Mental Status:  Awake, Alert, Oriented  Anterior segment: Cornea  Clear    Anterior Chamber Clear    Lens:   Cataract  Intra Ocular Pressure 8 mmHg with Tonopen  Vitreous: Clear 30%gas bubble   Retina:  Attached Good laser reaction   Impression: Excellent result Retina attached   Final Diagnosis: Principal Problem:   Vitreous hemorrhage, left eye (HCC)   Plan: start post operative eye drops.  Discharge to home.  Give post operative instructions  Sherrie GeorgeMATTHEWS, JOHN D 12/26/2014, 6:36 AM 12/26/2014, 6:36 AM

## 2014-12-28 ENCOUNTER — Encounter (INDEPENDENT_AMBULATORY_CARE_PROVIDER_SITE_OTHER): Payer: Self-pay | Admitting: Ophthalmology

## 2015-01-01 ENCOUNTER — Encounter (INDEPENDENT_AMBULATORY_CARE_PROVIDER_SITE_OTHER): Payer: Commercial Managed Care - HMO | Admitting: Ophthalmology

## 2015-01-01 DIAGNOSIS — H35032 Hypertensive retinopathy, left eye: Secondary | ICD-10-CM

## 2015-01-01 DIAGNOSIS — I10 Essential (primary) hypertension: Secondary | ICD-10-CM

## 2015-01-15 ENCOUNTER — Ambulatory Visit
Admission: RE | Admit: 2015-01-15 | Discharge: 2015-01-15 | Disposition: A | Discharge status: Home / Self Care | Payer: Commercial Managed Care - HMO | Source: Ambulatory Visit | Attending: Family Medicine | Admitting: Family Medicine

## 2015-01-15 DIAGNOSIS — M8580 Other specified disorders of bone density and structure, unspecified site: Secondary | ICD-10-CM

## 2015-01-15 DIAGNOSIS — M810 Age-related osteoporosis without current pathological fracture: Secondary | ICD-10-CM | POA: Diagnosis not present

## 2015-01-23 ENCOUNTER — Encounter (INDEPENDENT_AMBULATORY_CARE_PROVIDER_SITE_OTHER): Payer: Commercial Managed Care - HMO | Admitting: Ophthalmology

## 2015-01-23 DIAGNOSIS — H4312 Vitreous hemorrhage, left eye: Secondary | ICD-10-CM

## 2015-02-04 DIAGNOSIS — H35033 Hypertensive retinopathy, bilateral: Secondary | ICD-10-CM | POA: Diagnosis not present

## 2015-02-04 DIAGNOSIS — H5210 Myopia, unspecified eye: Secondary | ICD-10-CM | POA: Diagnosis not present

## 2015-02-04 DIAGNOSIS — H524 Presbyopia: Secondary | ICD-10-CM | POA: Diagnosis not present

## 2015-02-21 DIAGNOSIS — M810 Age-related osteoporosis without current pathological fracture: Secondary | ICD-10-CM | POA: Diagnosis not present

## 2015-02-28 DIAGNOSIS — J309 Allergic rhinitis, unspecified: Secondary | ICD-10-CM | POA: Diagnosis not present

## 2015-02-28 DIAGNOSIS — R6884 Jaw pain: Secondary | ICD-10-CM | POA: Diagnosis not present

## 2015-04-08 ENCOUNTER — Encounter (INDEPENDENT_AMBULATORY_CARE_PROVIDER_SITE_OTHER): Payer: Commercial Managed Care - HMO | Admitting: Ophthalmology

## 2015-04-08 DIAGNOSIS — H35033 Hypertensive retinopathy, bilateral: Secondary | ICD-10-CM

## 2015-04-08 DIAGNOSIS — H43813 Vitreous degeneration, bilateral: Secondary | ICD-10-CM

## 2015-04-08 DIAGNOSIS — I10 Essential (primary) hypertension: Secondary | ICD-10-CM | POA: Diagnosis not present

## 2015-04-08 DIAGNOSIS — H4312 Vitreous hemorrhage, left eye: Secondary | ICD-10-CM | POA: Diagnosis not present

## 2015-05-20 DIAGNOSIS — H2512 Age-related nuclear cataract, left eye: Secondary | ICD-10-CM | POA: Diagnosis not present

## 2015-05-20 DIAGNOSIS — H25012 Cortical age-related cataract, left eye: Secondary | ICD-10-CM | POA: Diagnosis not present

## 2015-05-20 DIAGNOSIS — H25011 Cortical age-related cataract, right eye: Secondary | ICD-10-CM | POA: Diagnosis not present

## 2015-05-20 DIAGNOSIS — H35032 Hypertensive retinopathy, left eye: Secondary | ICD-10-CM | POA: Diagnosis not present

## 2015-05-20 DIAGNOSIS — H35361 Drusen (degenerative) of macula, right eye: Secondary | ICD-10-CM | POA: Diagnosis not present

## 2015-05-20 DIAGNOSIS — H2511 Age-related nuclear cataract, right eye: Secondary | ICD-10-CM | POA: Diagnosis not present

## 2015-05-20 DIAGNOSIS — H35031 Hypertensive retinopathy, right eye: Secondary | ICD-10-CM | POA: Diagnosis not present

## 2015-05-30 ENCOUNTER — Other Ambulatory Visit: Payer: Self-pay

## 2015-05-30 DIAGNOSIS — Z1231 Encounter for screening mammogram for malignant neoplasm of breast: Secondary | ICD-10-CM

## 2015-06-04 DIAGNOSIS — H2512 Age-related nuclear cataract, left eye: Secondary | ICD-10-CM | POA: Diagnosis not present

## 2015-06-04 DIAGNOSIS — I10 Essential (primary) hypertension: Secondary | ICD-10-CM | POA: Diagnosis not present

## 2015-06-04 DIAGNOSIS — Z8669 Personal history of other diseases of the nervous system and sense organs: Secondary | ICD-10-CM | POA: Diagnosis not present

## 2015-06-04 DIAGNOSIS — H2511 Age-related nuclear cataract, right eye: Secondary | ICD-10-CM | POA: Diagnosis not present

## 2015-07-04 ENCOUNTER — Ambulatory Visit
Admission: RE | Admit: 2015-07-04 | Discharge: 2015-07-04 | Disposition: A | Discharge status: Home / Self Care | Payer: Commercial Managed Care - HMO | Source: Ambulatory Visit

## 2015-07-04 DIAGNOSIS — Z1231 Encounter for screening mammogram for malignant neoplasm of breast: Secondary | ICD-10-CM | POA: Diagnosis not present

## 2015-07-19 DIAGNOSIS — H2513 Age-related nuclear cataract, bilateral: Secondary | ICD-10-CM | POA: Diagnosis not present

## 2015-07-19 DIAGNOSIS — H2512 Age-related nuclear cataract, left eye: Secondary | ICD-10-CM | POA: Diagnosis not present

## 2015-07-20 DIAGNOSIS — S80861A Insect bite (nonvenomous), right lower leg, initial encounter: Secondary | ICD-10-CM | POA: Diagnosis not present

## 2015-07-20 DIAGNOSIS — T148 Other injury of unspecified body region: Secondary | ICD-10-CM | POA: Diagnosis not present

## 2015-09-03 DIAGNOSIS — M810 Age-related osteoporosis without current pathological fracture: Secondary | ICD-10-CM | POA: Diagnosis not present

## 2015-09-03 DIAGNOSIS — E782 Mixed hyperlipidemia: Secondary | ICD-10-CM | POA: Diagnosis not present

## 2015-09-03 DIAGNOSIS — R7301 Impaired fasting glucose: Secondary | ICD-10-CM | POA: Diagnosis not present

## 2015-09-03 DIAGNOSIS — I10 Essential (primary) hypertension: Secondary | ICD-10-CM | POA: Diagnosis not present

## 2015-09-06 DIAGNOSIS — Z23 Encounter for immunization: Secondary | ICD-10-CM | POA: Diagnosis not present

## 2015-09-06 DIAGNOSIS — I10 Essential (primary) hypertension: Secondary | ICD-10-CM | POA: Diagnosis not present

## 2015-09-06 DIAGNOSIS — R7301 Impaired fasting glucose: Secondary | ICD-10-CM | POA: Diagnosis not present

## 2015-09-06 DIAGNOSIS — M85852 Other specified disorders of bone density and structure, left thigh: Secondary | ICD-10-CM | POA: Diagnosis not present

## 2015-09-06 DIAGNOSIS — E559 Vitamin D deficiency, unspecified: Secondary | ICD-10-CM | POA: Diagnosis not present

## 2015-09-06 DIAGNOSIS — J309 Allergic rhinitis, unspecified: Secondary | ICD-10-CM | POA: Diagnosis not present

## 2015-09-06 DIAGNOSIS — K219 Gastro-esophageal reflux disease without esophagitis: Secondary | ICD-10-CM | POA: Diagnosis not present

## 2015-09-06 DIAGNOSIS — Z0000 Encounter for general adult medical examination without abnormal findings: Secondary | ICD-10-CM | POA: Diagnosis not present

## 2015-09-06 DIAGNOSIS — E782 Mixed hyperlipidemia: Secondary | ICD-10-CM | POA: Diagnosis not present

## 2015-10-18 DIAGNOSIS — Z961 Presence of intraocular lens: Secondary | ICD-10-CM | POA: Diagnosis not present

## 2015-10-18 DIAGNOSIS — H538 Other visual disturbances: Secondary | ICD-10-CM | POA: Diagnosis not present

## 2015-10-18 DIAGNOSIS — I10 Essential (primary) hypertension: Secondary | ICD-10-CM | POA: Diagnosis not present

## 2015-10-18 DIAGNOSIS — H35033 Hypertensive retinopathy, bilateral: Secondary | ICD-10-CM | POA: Diagnosis not present

## 2015-10-18 DIAGNOSIS — H539 Unspecified visual disturbance: Secondary | ICD-10-CM | POA: Diagnosis not present

## 2015-10-18 DIAGNOSIS — H2511 Age-related nuclear cataract, right eye: Secondary | ICD-10-CM | POA: Diagnosis not present

## 2015-10-21 ENCOUNTER — Other Ambulatory Visit: Payer: Self-pay | Admitting: Family Medicine

## 2015-10-21 DIAGNOSIS — H539 Unspecified visual disturbance: Secondary | ICD-10-CM

## 2015-10-23 ENCOUNTER — Encounter (INDEPENDENT_AMBULATORY_CARE_PROVIDER_SITE_OTHER): Payer: Commercial Managed Care - HMO | Admitting: Ophthalmology

## 2015-10-23 DIAGNOSIS — H43811 Vitreous degeneration, right eye: Secondary | ICD-10-CM | POA: Diagnosis not present

## 2015-10-23 DIAGNOSIS — I10 Essential (primary) hypertension: Secondary | ICD-10-CM

## 2015-10-23 DIAGNOSIS — H35033 Hypertensive retinopathy, bilateral: Secondary | ICD-10-CM | POA: Diagnosis not present

## 2015-10-23 DIAGNOSIS — H2512 Age-related nuclear cataract, left eye: Secondary | ICD-10-CM | POA: Diagnosis not present

## 2015-10-29 ENCOUNTER — Ambulatory Visit
Admission: RE | Admit: 2015-10-29 | Discharge: 2015-10-29 | Disposition: A | Discharge status: Home / Self Care | Payer: Commercial Managed Care - HMO | Source: Ambulatory Visit | Attending: Family Medicine | Admitting: Family Medicine

## 2015-10-29 DIAGNOSIS — H539 Unspecified visual disturbance: Secondary | ICD-10-CM

## 2015-10-29 DIAGNOSIS — I6522 Occlusion and stenosis of left carotid artery: Secondary | ICD-10-CM | POA: Diagnosis not present

## 2016-02-10 DIAGNOSIS — H26492 Other secondary cataract, left eye: Secondary | ICD-10-CM | POA: Diagnosis not present

## 2016-02-10 DIAGNOSIS — H52223 Regular astigmatism, bilateral: Secondary | ICD-10-CM | POA: Diagnosis not present

## 2016-03-24 DIAGNOSIS — J309 Allergic rhinitis, unspecified: Secondary | ICD-10-CM | POA: Diagnosis not present

## 2016-03-24 DIAGNOSIS — I10 Essential (primary) hypertension: Secondary | ICD-10-CM | POA: Diagnosis not present

## 2016-03-24 DIAGNOSIS — R7301 Impaired fasting glucose: Secondary | ICD-10-CM | POA: Diagnosis not present

## 2016-03-24 DIAGNOSIS — E559 Vitamin D deficiency, unspecified: Secondary | ICD-10-CM | POA: Diagnosis not present

## 2016-03-24 DIAGNOSIS — E782 Mixed hyperlipidemia: Secondary | ICD-10-CM | POA: Diagnosis not present

## 2016-03-24 DIAGNOSIS — K219 Gastro-esophageal reflux disease without esophagitis: Secondary | ICD-10-CM | POA: Diagnosis not present

## 2016-03-24 DIAGNOSIS — R1084 Generalized abdominal pain: Secondary | ICD-10-CM | POA: Diagnosis not present

## 2016-03-24 DIAGNOSIS — M810 Age-related osteoporosis without current pathological fracture: Secondary | ICD-10-CM | POA: Diagnosis not present

## 2016-04-06 ENCOUNTER — Ambulatory Visit (INDEPENDENT_AMBULATORY_CARE_PROVIDER_SITE_OTHER): Payer: Commercial Managed Care - HMO | Admitting: Ophthalmology

## 2016-06-15 ENCOUNTER — Other Ambulatory Visit: Payer: Self-pay | Admitting: Family Medicine

## 2016-06-15 DIAGNOSIS — Z1231 Encounter for screening mammogram for malignant neoplasm of breast: Secondary | ICD-10-CM

## 2016-07-06 ENCOUNTER — Ambulatory Visit
Admission: RE | Admit: 2016-07-06 | Discharge: 2016-07-06 | Disposition: A | Discharge status: Home / Self Care | Payer: Commercial Managed Care - HMO | Source: Ambulatory Visit | Attending: Family Medicine | Admitting: Family Medicine

## 2016-07-06 DIAGNOSIS — Z1231 Encounter for screening mammogram for malignant neoplasm of breast: Secondary | ICD-10-CM

## 2016-07-22 DIAGNOSIS — J309 Allergic rhinitis, unspecified: Secondary | ICD-10-CM | POA: Diagnosis not present

## 2016-07-22 DIAGNOSIS — I10 Essential (primary) hypertension: Secondary | ICD-10-CM | POA: Diagnosis not present

## 2016-07-22 DIAGNOSIS — K219 Gastro-esophageal reflux disease without esophagitis: Secondary | ICD-10-CM | POA: Diagnosis not present

## 2016-07-22 DIAGNOSIS — E559 Vitamin D deficiency, unspecified: Secondary | ICD-10-CM | POA: Diagnosis not present

## 2016-07-22 DIAGNOSIS — E782 Mixed hyperlipidemia: Secondary | ICD-10-CM | POA: Diagnosis not present

## 2016-07-22 DIAGNOSIS — M810 Age-related osteoporosis without current pathological fracture: Secondary | ICD-10-CM | POA: Diagnosis not present

## 2016-07-22 DIAGNOSIS — R7301 Impaired fasting glucose: Secondary | ICD-10-CM | POA: Diagnosis not present

## 2016-08-04 ENCOUNTER — Other Ambulatory Visit: Payer: Self-pay | Admitting: Family Medicine

## 2016-08-04 DIAGNOSIS — M8580 Other specified disorders of bone density and structure, unspecified site: Secondary | ICD-10-CM

## 2016-08-11 ENCOUNTER — Other Ambulatory Visit: Payer: Commercial Managed Care - HMO

## 2016-09-21 DIAGNOSIS — R7301 Impaired fasting glucose: Secondary | ICD-10-CM | POA: Diagnosis not present

## 2016-09-21 DIAGNOSIS — Z0001 Encounter for general adult medical examination with abnormal findings: Secondary | ICD-10-CM | POA: Diagnosis not present

## 2016-09-21 DIAGNOSIS — E782 Mixed hyperlipidemia: Secondary | ICD-10-CM | POA: Diagnosis not present

## 2016-09-21 DIAGNOSIS — K219 Gastro-esophageal reflux disease without esophagitis: Secondary | ICD-10-CM | POA: Diagnosis not present

## 2016-09-21 DIAGNOSIS — Z23 Encounter for immunization: Secondary | ICD-10-CM | POA: Diagnosis not present

## 2016-09-21 DIAGNOSIS — I10 Essential (primary) hypertension: Secondary | ICD-10-CM | POA: Diagnosis not present

## 2016-09-21 DIAGNOSIS — J309 Allergic rhinitis, unspecified: Secondary | ICD-10-CM | POA: Diagnosis not present

## 2016-09-21 DIAGNOSIS — Z0000 Encounter for general adult medical examination without abnormal findings: Secondary | ICD-10-CM | POA: Diagnosis not present

## 2016-09-21 DIAGNOSIS — E559 Vitamin D deficiency, unspecified: Secondary | ICD-10-CM | POA: Diagnosis not present

## 2016-09-23 DIAGNOSIS — I10 Essential (primary) hypertension: Secondary | ICD-10-CM | POA: Diagnosis not present

## 2016-09-23 DIAGNOSIS — Z1389 Encounter for screening for other disorder: Secondary | ICD-10-CM | POA: Diagnosis not present

## 2016-09-23 DIAGNOSIS — M810 Age-related osteoporosis without current pathological fracture: Secondary | ICD-10-CM | POA: Diagnosis not present

## 2016-09-23 DIAGNOSIS — Z0000 Encounter for general adult medical examination without abnormal findings: Secondary | ICD-10-CM | POA: Diagnosis not present

## 2016-09-23 DIAGNOSIS — Z23 Encounter for immunization: Secondary | ICD-10-CM | POA: Diagnosis not present

## 2016-09-23 DIAGNOSIS — E559 Vitamin D deficiency, unspecified: Secondary | ICD-10-CM | POA: Diagnosis not present

## 2016-09-23 DIAGNOSIS — E782 Mixed hyperlipidemia: Secondary | ICD-10-CM | POA: Diagnosis not present

## 2016-09-23 DIAGNOSIS — Z124 Encounter for screening for malignant neoplasm of cervix: Secondary | ICD-10-CM | POA: Diagnosis not present

## 2016-09-23 DIAGNOSIS — J309 Allergic rhinitis, unspecified: Secondary | ICD-10-CM | POA: Diagnosis not present

## 2016-09-23 DIAGNOSIS — R7301 Impaired fasting glucose: Secondary | ICD-10-CM | POA: Diagnosis not present

## 2016-10-05 DIAGNOSIS — R300 Dysuria: Secondary | ICD-10-CM | POA: Diagnosis not present

## 2016-10-27 DIAGNOSIS — B373 Candidiasis of vulva and vagina: Secondary | ICD-10-CM | POA: Diagnosis not present

## 2016-10-27 DIAGNOSIS — R300 Dysuria: Secondary | ICD-10-CM | POA: Diagnosis not present

## 2016-10-27 DIAGNOSIS — N952 Postmenopausal atrophic vaginitis: Secondary | ICD-10-CM | POA: Diagnosis not present

## 2016-10-29 ENCOUNTER — Ambulatory Visit (INDEPENDENT_AMBULATORY_CARE_PROVIDER_SITE_OTHER): Payer: Commercial Managed Care - HMO | Admitting: Ophthalmology

## 2016-11-26 ENCOUNTER — Encounter (INDEPENDENT_AMBULATORY_CARE_PROVIDER_SITE_OTHER): Payer: Medicare HMO | Admitting: Ophthalmology

## 2016-11-26 DIAGNOSIS — H35033 Hypertensive retinopathy, bilateral: Secondary | ICD-10-CM | POA: Diagnosis not present

## 2016-11-26 DIAGNOSIS — H26492 Other secondary cataract, left eye: Secondary | ICD-10-CM | POA: Diagnosis not present

## 2016-11-26 DIAGNOSIS — I10 Essential (primary) hypertension: Secondary | ICD-10-CM

## 2016-11-26 DIAGNOSIS — H2511 Age-related nuclear cataract, right eye: Secondary | ICD-10-CM | POA: Diagnosis not present

## 2016-11-26 DIAGNOSIS — H43813 Vitreous degeneration, bilateral: Secondary | ICD-10-CM

## 2017-03-25 ENCOUNTER — Encounter (INDEPENDENT_AMBULATORY_CARE_PROVIDER_SITE_OTHER): Payer: Self-pay | Admitting: Orthopedic Surgery

## 2017-03-25 ENCOUNTER — Ambulatory Visit (INDEPENDENT_AMBULATORY_CARE_PROVIDER_SITE_OTHER): Payer: Medicare HMO | Admitting: Orthopedic Surgery

## 2017-03-25 VITALS — Ht 63.0 in | Wt 129.0 lb

## 2017-03-25 DIAGNOSIS — S92354A Nondisplaced fracture of fifth metatarsal bone, right foot, initial encounter for closed fracture: Secondary | ICD-10-CM | POA: Diagnosis not present

## 2017-03-25 DIAGNOSIS — M84375A Stress fracture, left foot, initial encounter for fracture: Secondary | ICD-10-CM

## 2017-03-25 DIAGNOSIS — I10 Essential (primary) hypertension: Secondary | ICD-10-CM | POA: Diagnosis not present

## 2017-03-25 DIAGNOSIS — M79672 Pain in left foot: Secondary | ICD-10-CM | POA: Diagnosis not present

## 2017-03-25 DIAGNOSIS — K219 Gastro-esophageal reflux disease without esophagitis: Secondary | ICD-10-CM | POA: Diagnosis not present

## 2017-03-25 DIAGNOSIS — J309 Allergic rhinitis, unspecified: Secondary | ICD-10-CM | POA: Diagnosis not present

## 2017-03-25 DIAGNOSIS — E782 Mixed hyperlipidemia: Secondary | ICD-10-CM | POA: Diagnosis not present

## 2017-03-25 DIAGNOSIS — R7301 Impaired fasting glucose: Secondary | ICD-10-CM | POA: Diagnosis not present

## 2017-03-25 DIAGNOSIS — M810 Age-related osteoporosis without current pathological fracture: Secondary | ICD-10-CM | POA: Diagnosis not present

## 2017-03-25 NOTE — Progress Notes (Signed)
Office Visit Note   Patient: Alicia [name]           Date of Birth: 07-31-1945           MRN: [id number] Visit Date: 03/25/2017              Requested by: Gweneth DimitriMcNeill, Wendy, MD [address] [place], [place] [address] PCP: Gweneth DimitriMcNeill, Wendy, MD  No chief complaint on file.     HPI: Patient is a 72 year old woman who states that she was at [employer] she slipped getting out of the bathroom and sustained a stress fracture of the fifth metatarsal left foot.  Assessment & Plan: Visit Diagnoses:  1. Metatarsal stress fracture, left, initial encounter     Plan: Patient is given a postoperative shoe to wear this until she can get a pair of Trail running sneakers that are not flexible.  Discussed that she will need to use a wheelchair or kneeling scooter for any prolonged walking and can use the postoperative shoe or traveling sooner for regular activities of daily living.  Follow-up in 4-6 weeks if she is still symptomatic.  Follow-Up Instructions: Return if symptoms worsen or fail to improve.   Ortho Exam  Patient is alert, oriented, no adenopathy, well-dressed, normal affect, normal respiratory effort. Examination patient has good dorsalis pedis pulse she has some bruising and swelling over the fifth metatarsal.  Radiographs were reviewed which shows a spiral nondisplaced fracture of the shaft of the fifth metatarsal left foot.  Imaging: No results found. No images are attached to the encounter.  Labs: No results found for: HGBA1C, ESRSEDRATE, CRP, LABURIC, REPTSTATUS, GRAMSTAIN, CULT, LABORGA  @LABSALLVALUES (HGBA1)@  Body mass index is 22.85 kg/m.  Orders:  No orders of the defined types were placed in this encounter.  No orders of the defined types were placed in this encounter.    Procedures: No procedures performed  Clinical Data: No additional findings.  ROS:  All other systems negative, except as noted in the HPI. Review of Systems  Objective: Vital Signs: Ht  5\' 3"  (1.6 m)   Wt 129 lb (58.5 kg)   BMI 22.85 kg/m   Specialty Comments:  No specialty comments available.  PMFS History: Patient Active Problem List   Diagnosis Date Noted  . Vitreous hemorrhage, left eye (HCC) 12/25/2014   Past Medical History:  Diagnosis Date  . Bell's palsy   . Cataracts, bilateral    immature  . GERD (gastroesophageal reflux disease)    takes OMeprazole daily  . History of colon polyps    benign  . Hyperlipidemia    was on meds but has been off for over 6 months  . Hypertension    takes LIsinopril-HCTZ and [place] daily  . Insomnia    OTC sleep aide  . Lyme disease    hx of  . MVP (mitral valve prolapse)    per pt very minor.Echo done > 8 yrs ago  . Nocturia   . PONV (postoperative nausea and vomiting)   . Post-nasal drip    takes Flonase daily    Family History  Problem Relation Age of Onset  . Breast cancer Neg Hx     Past Surgical History:  Procedure Laterality Date  . CERVICAL CONE BIOPSY    . COLONOSCOPY    . DILATION AND CURETTAGE OF UTERUS    . GAS/FLUID EXCHANGE Left 12/25/2014   Procedure: GAS/FLUID EXCHANGE LEFT EYE;  Surgeon: Sherrie GeorgeJohn D Matthews, MD;  Location: [facility] OR;  Service:  Ophthalmology;  Laterality: Left;  . PARS PLANA VITRECTOMY Left 12/25/2014   laser, membrane peel, gas injection  . PARS PLANA VITRECTOMY Left 12/25/2014   Procedure: PARS PLANA VITRECTOMY WITH 25 GAUGE LEFT EYE;  HEADSCOPE LASER;  Surgeon: Sherrie George, MD;  Location: [facility] OR;  Service: Ophthalmology;  Laterality: Left;  . SHOULDER ARTHROSCOPY Left    "to remove scar tissue"  . thumb surgery Right 1963   "straighted out deformed thumb"  . TONSILLECTOMY     Social History   Occupational History  . Not on file  Tobacco Use  . Smoking status: Former Smoker    Packs/day: 0.33    Years: 5.00    Pack years: 1.65    Types: Cigarettes  . Smokeless tobacco: Never Used  . Tobacco comment: quit smoking in the 1970's  Substance and Sexual Activity  .  Alcohol use: Yes    Alcohol/week: 8.4 oz    Types: 7 Glasses of wine, 7 Shots of liquor per week  . Drug use: No  . Sexual activity: No

## 2017-03-30 ENCOUNTER — Other Ambulatory Visit: Payer: Self-pay | Admitting: Family Medicine

## 2017-03-30 DIAGNOSIS — M810 Age-related osteoporosis without current pathological fracture: Secondary | ICD-10-CM

## 2017-04-05 DIAGNOSIS — H524 Presbyopia: Secondary | ICD-10-CM | POA: Diagnosis not present

## 2017-04-08 ENCOUNTER — Telehealth (INDEPENDENT_AMBULATORY_CARE_PROVIDER_SITE_OTHER): Payer: Self-pay | Admitting: Orthopedic Surgery

## 2017-04-08 NOTE — Telephone Encounter (Signed)
Please call pt to discuss pt care. Pt called left Vm would like to speak with the nurse or Dr.Duda. Pt has questions for the doctor.

## 2017-04-08 NOTE — Telephone Encounter (Signed)
Pt is s/p a 5th MT fx about 2 weeks out.she is taking ibuprofen prn and icing and elevating through the day. She asked if she could wear an ace bandage at night and I advised that this would be ok if it was comfortable for her. She states that she would like the support while she is asleep bc she is a "rough sleeper" and this would make her feel more secure. Pt will call to check in the next few weeks and let us know how she is doing. She will call with any questions or concerns.

## 2017-05-03 ENCOUNTER — Other Ambulatory Visit: Payer: Medicare HMO

## 2017-05-04 ENCOUNTER — Ambulatory Visit
Admission: RE | Admit: 2017-05-04 | Discharge: 2017-05-04 | Disposition: A | Discharge status: Home / Self Care | Payer: Medicare HMO | Source: Ambulatory Visit | Attending: Family Medicine | Admitting: Family Medicine

## 2017-05-04 DIAGNOSIS — M8589 Other specified disorders of bone density and structure, multiple sites: Secondary | ICD-10-CM | POA: Diagnosis not present

## 2017-05-04 DIAGNOSIS — M810 Age-related osteoporosis without current pathological fracture: Secondary | ICD-10-CM

## 2017-05-04 DIAGNOSIS — Z780 Asymptomatic menopausal state: Secondary | ICD-10-CM | POA: Diagnosis not present

## 2017-06-28 ENCOUNTER — Other Ambulatory Visit: Payer: Self-pay | Admitting: Family Medicine

## 2017-06-28 DIAGNOSIS — Z1231 Encounter for screening mammogram for malignant neoplasm of breast: Secondary | ICD-10-CM

## 2017-07-22 ENCOUNTER — Ambulatory Visit
Admission: RE | Admit: 2017-07-22 | Discharge: 2017-07-22 | Disposition: A | Discharge status: Home / Self Care | Payer: Medicare HMO | Source: Ambulatory Visit | Attending: Family Medicine | Admitting: Family Medicine

## 2017-07-22 DIAGNOSIS — Z1231 Encounter for screening mammogram for malignant neoplasm of breast: Secondary | ICD-10-CM

## 2017-09-23 DIAGNOSIS — R300 Dysuria: Secondary | ICD-10-CM | POA: Diagnosis not present

## 2017-09-23 DIAGNOSIS — J309 Allergic rhinitis, unspecified: Secondary | ICD-10-CM | POA: Diagnosis not present

## 2017-09-23 DIAGNOSIS — R7301 Impaired fasting glucose: Secondary | ICD-10-CM | POA: Diagnosis not present

## 2017-09-23 DIAGNOSIS — Z0000 Encounter for general adult medical examination without abnormal findings: Secondary | ICD-10-CM | POA: Diagnosis not present

## 2017-09-23 DIAGNOSIS — E782 Mixed hyperlipidemia: Secondary | ICD-10-CM | POA: Diagnosis not present

## 2017-09-23 DIAGNOSIS — I10 Essential (primary) hypertension: Secondary | ICD-10-CM | POA: Diagnosis not present

## 2017-09-23 DIAGNOSIS — K219 Gastro-esophageal reflux disease without esophagitis: Secondary | ICD-10-CM | POA: Diagnosis not present

## 2017-09-23 DIAGNOSIS — M810 Age-related osteoporosis without current pathological fracture: Secondary | ICD-10-CM | POA: Diagnosis not present

## 2017-09-23 DIAGNOSIS — E559 Vitamin D deficiency, unspecified: Secondary | ICD-10-CM | POA: Diagnosis not present

## 2017-09-27 DIAGNOSIS — E782 Mixed hyperlipidemia: Secondary | ICD-10-CM | POA: Diagnosis not present

## 2017-09-27 DIAGNOSIS — N289 Disorder of kidney and ureter, unspecified: Secondary | ICD-10-CM | POA: Diagnosis not present

## 2017-09-27 DIAGNOSIS — R7301 Impaired fasting glucose: Secondary | ICD-10-CM | POA: Diagnosis not present

## 2017-09-27 DIAGNOSIS — Z0000 Encounter for general adult medical examination without abnormal findings: Secondary | ICD-10-CM | POA: Diagnosis not present

## 2017-09-27 DIAGNOSIS — M810 Age-related osteoporosis without current pathological fracture: Secondary | ICD-10-CM | POA: Diagnosis not present

## 2017-09-27 DIAGNOSIS — Z1389 Encounter for screening for other disorder: Secondary | ICD-10-CM | POA: Diagnosis not present

## 2017-09-27 DIAGNOSIS — J309 Allergic rhinitis, unspecified: Secondary | ICD-10-CM | POA: Diagnosis not present

## 2017-09-27 DIAGNOSIS — E559 Vitamin D deficiency, unspecified: Secondary | ICD-10-CM | POA: Diagnosis not present

## 2017-09-27 DIAGNOSIS — Z23 Encounter for immunization: Secondary | ICD-10-CM | POA: Diagnosis not present

## 2017-09-27 DIAGNOSIS — I10 Essential (primary) hypertension: Secondary | ICD-10-CM | POA: Diagnosis not present

## 2017-09-27 DIAGNOSIS — K219 Gastro-esophageal reflux disease without esophagitis: Secondary | ICD-10-CM | POA: Diagnosis not present

## 2017-11-13 DIAGNOSIS — J069 Acute upper respiratory infection, unspecified: Secondary | ICD-10-CM | POA: Diagnosis not present

## 2017-11-13 DIAGNOSIS — R05 Cough: Secondary | ICD-10-CM | POA: Diagnosis not present

## 2017-11-29 ENCOUNTER — Encounter (INDEPENDENT_AMBULATORY_CARE_PROVIDER_SITE_OTHER): Payer: Medicare HMO | Admitting: Ophthalmology

## 2017-12-02 ENCOUNTER — Encounter (INDEPENDENT_AMBULATORY_CARE_PROVIDER_SITE_OTHER): Payer: Medicare HMO | Admitting: Ophthalmology

## 2017-12-02 DIAGNOSIS — H26492 Other secondary cataract, left eye: Secondary | ICD-10-CM | POA: Diagnosis not present

## 2017-12-02 DIAGNOSIS — H2511 Age-related nuclear cataract, right eye: Secondary | ICD-10-CM | POA: Diagnosis not present

## 2017-12-02 DIAGNOSIS — H35033 Hypertensive retinopathy, bilateral: Secondary | ICD-10-CM

## 2017-12-02 DIAGNOSIS — H43811 Vitreous degeneration, right eye: Secondary | ICD-10-CM | POA: Diagnosis not present

## 2017-12-02 DIAGNOSIS — I10 Essential (primary) hypertension: Secondary | ICD-10-CM | POA: Diagnosis not present

## 2017-12-17 ENCOUNTER — Encounter (INDEPENDENT_AMBULATORY_CARE_PROVIDER_SITE_OTHER): Payer: Medicare HMO | Admitting: Ophthalmology

## 2017-12-17 DIAGNOSIS — H2702 Aphakia, left eye: Secondary | ICD-10-CM

## 2018-03-29 DIAGNOSIS — E782 Mixed hyperlipidemia: Secondary | ICD-10-CM | POA: Diagnosis not present

## 2018-03-29 DIAGNOSIS — I10 Essential (primary) hypertension: Secondary | ICD-10-CM | POA: Diagnosis not present

## 2018-03-29 DIAGNOSIS — E559 Vitamin D deficiency, unspecified: Secondary | ICD-10-CM | POA: Diagnosis not present

## 2018-03-29 DIAGNOSIS — M810 Age-related osteoporosis without current pathological fracture: Secondary | ICD-10-CM | POA: Diagnosis not present

## 2018-03-29 DIAGNOSIS — Z0000 Encounter for general adult medical examination without abnormal findings: Secondary | ICD-10-CM | POA: Diagnosis not present

## 2018-03-29 DIAGNOSIS — N289 Disorder of kidney and ureter, unspecified: Secondary | ICD-10-CM | POA: Diagnosis not present

## 2018-03-29 DIAGNOSIS — J309 Allergic rhinitis, unspecified: Secondary | ICD-10-CM | POA: Diagnosis not present

## 2018-03-29 DIAGNOSIS — K219 Gastro-esophageal reflux disease without esophagitis: Secondary | ICD-10-CM | POA: Diagnosis not present

## 2018-03-29 DIAGNOSIS — R7301 Impaired fasting glucose: Secondary | ICD-10-CM | POA: Diagnosis not present

## 2018-04-25 DIAGNOSIS — R109 Unspecified abdominal pain: Secondary | ICD-10-CM | POA: Diagnosis not present

## 2018-04-25 DIAGNOSIS — I10 Essential (primary) hypertension: Secondary | ICD-10-CM | POA: Diagnosis not present

## 2018-06-30 DIAGNOSIS — E871 Hypo-osmolality and hyponatremia: Secondary | ICD-10-CM | POA: Diagnosis not present

## 2018-07-05 ENCOUNTER — Other Ambulatory Visit: Payer: Self-pay | Admitting: Family Medicine

## 2018-07-05 DIAGNOSIS — Z1231 Encounter for screening mammogram for malignant neoplasm of breast: Secondary | ICD-10-CM

## 2018-08-19 ENCOUNTER — Ambulatory Visit
Admission: RE | Admit: 2018-08-19 | Discharge: 2018-08-19 | Disposition: A | Discharge status: Home / Self Care | Payer: Medicare HMO | Source: Ambulatory Visit | Attending: Family Medicine | Admitting: Family Medicine

## 2018-08-19 ENCOUNTER — Other Ambulatory Visit: Payer: Self-pay

## 2018-08-19 DIAGNOSIS — Z1231 Encounter for screening mammogram for malignant neoplasm of breast: Secondary | ICD-10-CM | POA: Diagnosis not present

## 2018-12-06 DIAGNOSIS — E782 Mixed hyperlipidemia: Secondary | ICD-10-CM | POA: Diagnosis not present

## 2018-12-06 DIAGNOSIS — R7301 Impaired fasting glucose: Secondary | ICD-10-CM | POA: Diagnosis not present

## 2018-12-06 DIAGNOSIS — L659 Nonscarring hair loss, unspecified: Secondary | ICD-10-CM | POA: Diagnosis not present

## 2018-12-06 DIAGNOSIS — E559 Vitamin D deficiency, unspecified: Secondary | ICD-10-CM | POA: Diagnosis not present

## 2018-12-06 DIAGNOSIS — E871 Hypo-osmolality and hyponatremia: Secondary | ICD-10-CM | POA: Diagnosis not present

## 2018-12-06 DIAGNOSIS — K219 Gastro-esophageal reflux disease without esophagitis: Secondary | ICD-10-CM | POA: Diagnosis not present

## 2018-12-06 DIAGNOSIS — I10 Essential (primary) hypertension: Secondary | ICD-10-CM | POA: Diagnosis not present

## 2018-12-06 DIAGNOSIS — N289 Disorder of kidney and ureter, unspecified: Secondary | ICD-10-CM | POA: Diagnosis not present

## 2018-12-06 DIAGNOSIS — Z0000 Encounter for general adult medical examination without abnormal findings: Secondary | ICD-10-CM | POA: Diagnosis not present

## 2018-12-06 DIAGNOSIS — R109 Unspecified abdominal pain: Secondary | ICD-10-CM | POA: Diagnosis not present

## 2018-12-13 DIAGNOSIS — I10 Essential (primary) hypertension: Secondary | ICD-10-CM | POA: Diagnosis not present

## 2018-12-13 DIAGNOSIS — Z0000 Encounter for general adult medical examination without abnormal findings: Secondary | ICD-10-CM | POA: Diagnosis not present

## 2018-12-13 DIAGNOSIS — J309 Allergic rhinitis, unspecified: Secondary | ICD-10-CM | POA: Diagnosis not present

## 2018-12-13 DIAGNOSIS — E782 Mixed hyperlipidemia: Secondary | ICD-10-CM | POA: Diagnosis not present

## 2018-12-13 DIAGNOSIS — K219 Gastro-esophageal reflux disease without esophagitis: Secondary | ICD-10-CM | POA: Diagnosis not present

## 2018-12-13 DIAGNOSIS — Z1389 Encounter for screening for other disorder: Secondary | ICD-10-CM | POA: Diagnosis not present

## 2018-12-13 DIAGNOSIS — H3562 Retinal hemorrhage, left eye: Secondary | ICD-10-CM | POA: Diagnosis not present

## 2018-12-13 DIAGNOSIS — E559 Vitamin D deficiency, unspecified: Secondary | ICD-10-CM | POA: Diagnosis not present

## 2018-12-13 DIAGNOSIS — R7301 Impaired fasting glucose: Secondary | ICD-10-CM | POA: Diagnosis not present

## 2018-12-19 ENCOUNTER — Encounter (INDEPENDENT_AMBULATORY_CARE_PROVIDER_SITE_OTHER): Payer: Medicare HMO | Admitting: Ophthalmology

## 2019-02-01 ENCOUNTER — Ambulatory Visit: Payer: Medicare HMO | Attending: Internal Medicine

## 2019-02-01 DIAGNOSIS — Z23 Encounter for immunization: Secondary | ICD-10-CM

## 2019-02-01 NOTE — Progress Notes (Signed)
   Covid-19 Vaccination Clinic  Name:  Alicia [name]    MRN: [id number] DOB: 08/27/1945  02/01/2019  Alicia [name] was observed post Covid-19 immunization for 15 minutes without incidence. She was provided with Vaccine Information Sheet and instruction to access the V-Safe system.   Alicia [name] was instructed to call 911 with any severe reactions post vaccine: Marland Kitchen Difficulty breathing  . Swelling of your face and throat  . A fast heartbeat  . A bad rash all over your body  . Dizziness and weakness    Immunizations Administered    Name Date Dose VIS Date Route   Pfizer COVID-19 Vaccine 02/01/2019  8:47 AM 0.3 mL 12/23/2018 Intramuscular   Manufacturer: [employer], [employer]   Lot: [id number]   NDC: [address]-1

## 2019-02-02 ENCOUNTER — Encounter (INDEPENDENT_AMBULATORY_CARE_PROVIDER_SITE_OTHER): Payer: Medicare HMO | Admitting: Ophthalmology

## 2019-02-02 ENCOUNTER — Other Ambulatory Visit: Payer: Self-pay

## 2019-02-02 DIAGNOSIS — H43813 Vitreous degeneration, bilateral: Secondary | ICD-10-CM | POA: Diagnosis not present

## 2019-02-02 DIAGNOSIS — I10 Essential (primary) hypertension: Secondary | ICD-10-CM

## 2019-02-02 DIAGNOSIS — H2511 Age-related nuclear cataract, right eye: Secondary | ICD-10-CM

## 2019-02-02 DIAGNOSIS — H35033 Hypertensive retinopathy, bilateral: Secondary | ICD-10-CM | POA: Diagnosis not present

## 2019-02-19 ENCOUNTER — Ambulatory Visit: Payer: Medicare HMO | Attending: Internal Medicine

## 2019-02-19 DIAGNOSIS — Z23 Encounter for immunization: Secondary | ICD-10-CM | POA: Insufficient documentation

## 2019-02-19 NOTE — Progress Notes (Signed)
   Covid-19 Vaccination Clinic  Name:  Alicia [name]    MRN: [id number] DOB: 02-12-1945  02/19/2019  Alicia [name] was observed post Covid-19 immunization for 15 minutes without incidence. She was provided with Vaccine Information Sheet and instruction to access the V-Safe system.   Alicia [name] was instructed to call 911 with any severe reactions post vaccine: Marland Kitchen Difficulty breathing  . Swelling of your face and throat  . A fast heartbeat  . A bad rash all over your body  . Dizziness and weakness    Immunizations Administered    Name Date Dose VIS Date Route   Pfizer COVID-19 Vaccine 02/19/2019  1:09 PM 0.3 mL 12/23/2018 Intramuscular   Manufacturer: [employer], [employer]   Lot: [id number]   NDC: [address]-8

## 2019-03-14 DIAGNOSIS — H35033 Hypertensive retinopathy, bilateral: Secondary | ICD-10-CM | POA: Diagnosis not present

## 2019-03-14 DIAGNOSIS — I10 Essential (primary) hypertension: Secondary | ICD-10-CM | POA: Diagnosis not present

## 2019-03-14 DIAGNOSIS — H52223 Regular astigmatism, bilateral: Secondary | ICD-10-CM | POA: Diagnosis not present

## 2019-05-30 ENCOUNTER — Ambulatory Visit (INDEPENDENT_AMBULATORY_CARE_PROVIDER_SITE_OTHER): Payer: Medicare HMO

## 2019-05-30 ENCOUNTER — Other Ambulatory Visit: Payer: Self-pay

## 2019-05-30 ENCOUNTER — Ambulatory Visit: Payer: Medicare HMO | Admitting: Orthopedic Surgery

## 2019-05-30 ENCOUNTER — Encounter: Payer: Self-pay | Admitting: Orthopedic Surgery

## 2019-05-30 VITALS — Ht 63.0 in | Wt 129.0 lb

## 2019-05-30 DIAGNOSIS — M25512 Pain in left shoulder: Secondary | ICD-10-CM

## 2019-05-30 DIAGNOSIS — L600 Ingrowing nail: Secondary | ICD-10-CM | POA: Diagnosis not present

## 2019-05-30 DIAGNOSIS — M25562 Pain in left knee: Secondary | ICD-10-CM

## 2019-06-05 ENCOUNTER — Encounter: Payer: Self-pay | Admitting: Orthopedic Surgery

## 2019-06-05 NOTE — Progress Notes (Signed)
Office Visit Note   Patient: Alicia [name]           Date of Birth: 03-Aug-1945           MRN: [id number] Visit Date: 05/30/2019              Requested by: Cari Caraway, [place],  [place] [address] PCP: Cari Caraway, MD  Chief Complaint  Patient presents with  . Left Shoulder - Pain    S/p fall 05/19/19  . Left Knee - Pain      HPI: Patient is a 74 year old woman who states that this past Friday she fell landing on the left shoulder left arm and left knee.  Patient has had difficulty with sleeping on the left side and has had decreased range of motion.  Assessment & Plan: Visit Diagnoses:  1. Acute pain of left shoulder   2. Acute pain of left knee     Plan: Discussed that there were no fractures patient may resume her water aerobics and resume her activities as tolerated.  Follow-Up Instructions: Return if symptoms worsen or fail to improve.   Ortho Exam  Patient is alert, oriented, no adenopathy, well-dressed, normal affect, normal respiratory effort. Examination of the left shoulder patient does have decreased range of motion of the left shoulder the biceps tendon is tender to palpation.  She has no pain with a Neer or Hawkins impingement test.  Patient does have ecchymosis around the left knee but no effusion collaterals and cruciates are stable.  Imaging: No results found. No images are attached to the encounter.  Labs: No results found for: HGBA1C, ESRSEDRATE, CRP, LABURIC, REPTSTATUS, GRAMSTAIN, CULT, LABORGA   No results found for: ALBUMIN, PREALBUMIN, LABURIC  No results found for: MG No results found for: VD25OH  No results found for: PREALBUMIN CBC EXTENDED Latest Ref Rng & Units 12/25/2014 06/09/2008  WBC 4.0 - 10.5 K/uL 4.4 -  RBC 3.87 - 5.11 MIL/uL 3.83(L) -  HGB 12.0 - 15.0 g/dL 11.9(L) 14.3  HCT 36.0 - 46.0 % 36.6 42.0  PLT 150 - 400 K/uL 250 -     Body mass index is 22.85 kg/m.  Orders:  Orders Placed This Encounter    Procedures  . XR Shoulder Left  . XR Knee 1-2 Views Left   No orders of the defined types were placed in this encounter.    Procedures: No procedures performed  Clinical Data: No additional findings.  ROS:  All other systems negative, except as noted in the HPI. Review of Systems  Objective: Vital Signs: Ht 5\' 3"  (1.6 m)   Wt 129 lb (58.5 kg)   BMI 22.85 kg/m   Specialty Comments:  No specialty comments available.  PMFS History: Patient Active Problem List   Diagnosis Date Noted  . Vitreous hemorrhage, left eye ([facility]) 12/25/2014   Past Medical History:  Diagnosis Date  . Bell's palsy   . Cataracts, bilateral    immature  . GERD (gastroesophageal reflux disease)    takes OMeprazole daily  . History of colon polyps    benign  . Hyperlipidemia    was on meds but has been off for over 6 months  . Hypertension    takes LIsinopril-HCTZ and [place] daily  . Insomnia    OTC sleep aide  . Lyme disease    hx of  . MVP (mitral valve prolapse)    per pt very minor.Echo done > 8 yrs ago  . Nocturia   .  PONV (postoperative nausea and vomiting)   . Post-nasal drip    takes Flonase daily    Family History  Problem Relation Age of Onset  . Breast cancer Neg Hx     Past Surgical History:  Procedure Laterality Date  . CERVICAL CONE BIOPSY    . COLONOSCOPY    . DILATION AND CURETTAGE OF UTERUS    . GAS/FLUID EXCHANGE Left 12/25/2014   Procedure: GAS/FLUID EXCHANGE LEFT EYE;  Surgeon: Sherrie George, MD;  Location: [facility] OR;  Service: Ophthalmology;  Laterality: Left;  . PARS PLANA VITRECTOMY Left 12/25/2014   laser, membrane peel, gas injection  . PARS PLANA VITRECTOMY Left 12/25/2014   Procedure: PARS PLANA VITRECTOMY WITH 25 GAUGE LEFT EYE;  HEADSCOPE LASER;  Surgeon: Sherrie George, MD;  Location: [facility] OR;  Service: Ophthalmology;  Laterality: Left;  . SHOULDER ARTHROSCOPY Left    "to remove scar tissue"  . thumb surgery Right 1963   "straighted out deformed  thumb"  . TONSILLECTOMY     Social History   Occupational History  . Not on file  Tobacco Use  . Smoking status: Former Smoker    Packs/day: 0.33    Years: 5.00    Pack years: 1.65    Types: Cigarettes  . Smokeless tobacco: Never Used  . Tobacco comment: quit smoking in the 1970's  Substance and Sexual Activity  . Alcohol use: Yes    Alcohol/week: 14.0 standard drinks    Types: 7 Glasses of wine, 7 Shots of liquor per week  . Drug use: No  . Sexual activity: Never

## 2019-07-03 ENCOUNTER — Other Ambulatory Visit: Payer: Self-pay

## 2019-07-03 ENCOUNTER — Encounter: Payer: Self-pay | Admitting: Orthopedic Surgery

## 2019-07-03 ENCOUNTER — Ambulatory Visit: Payer: Medicare HMO | Admitting: Orthopedic Surgery

## 2019-07-03 VITALS — Ht 63.0 in | Wt 129.0 lb

## 2019-07-03 DIAGNOSIS — M533 Sacrococcygeal disorders, not elsewhere classified: Secondary | ICD-10-CM

## 2019-07-03 NOTE — Progress Notes (Signed)
Office Visit Note   Patient: Alicia [name]           Date of Birth: [birth year]           MRN: [id number] Visit Date: 07/03/2019              Requested by: Cari Caraway, [place],  [place] [address] PCP: Cari Caraway, MD  Chief Complaint  Patient presents with   Left Shoulder - Follow-up    S/p fall 05/19/19 Patient c/o Tailbone pain     Left Knee - Follow-up      HPI: Patient presents status post fall in May she had left shoulder pain and left knee pain she states that these have resolved and are asymptomatic.  Patient states she is having coccyx pain.  She states she years ago had an acute trauma and this has been asymptomatic until recently and she is started developing pain with sitting.  She states with using an offloading donut and ibuprofen the pain completely resolves.  Assessment & Plan: Visit Diagnoses:  1. Coccyx pain     Plan: Discussed that she could try Voltaren gel to see if this would relieve her symptoms similar to oral ibuprofen.  If she is still symptomatic she will follow-up and we will obtain radiographs.  Follow-Up Instructions: Return if symptoms worsen or fail to improve.   Ortho Exam  Patient is alert, oriented, no adenopathy, well-dressed, normal affect, normal respiratory effort. Patient's left shoulder and left knee have completely resolved she has a normal gait full range of motion of the left shoulder she currently does not have pain with sitting.  No radicular symptoms.  Imaging: No results found. No images are attached to the encounter.  Labs: No results found for: HGBA1C, ESRSEDRATE, CRP, LABURIC, REPTSTATUS, GRAMSTAIN, CULT, LABORGA   No results found for: ALBUMIN, PREALBUMIN, LABURIC  No results found for: MG No results found for: VD25OH  No results found for: PREALBUMIN CBC EXTENDED Latest Ref Rng & Units 12/25/2014 06/09/2008  WBC 4.0 - 10.5 K/uL 4.4 -  RBC 3.87 - 5.11 MIL/uL 3.83(L) -  HGB 12.0 - 15.0  g/dL 11.9(L) 14.3  HCT 36 - 46 % 36.6 42.0  PLT 150 - 400 K/uL 250 -     Body mass index is 22.85 kg/m.  Orders:  No orders of the defined types were placed in this encounter.  No orders of the defined types were placed in this encounter.    Procedures: No procedures performed  Clinical Data: No additional findings.  ROS:  All other systems negative, except as noted in the HPI. Review of Systems  Objective: Vital Signs: Ht 5\' 3"  (1.6 m)    Wt 129 lb (58.5 kg)    BMI 22.85 kg/m   Specialty Comments:  No specialty comments available.  PMFS History: Patient Active Problem List   Diagnosis Date Noted   Vitreous hemorrhage, left eye ([place]) 12/25/2014   Past Medical History:  Diagnosis Date   Bell's palsy    Cataracts, bilateral    immature   GERD (gastroesophageal reflux disease)    takes OMeprazole daily   History of colon polyps    benign   Hyperlipidemia    was on meds but has been off for over 6 months   Hypertension    takes LIsinopril-HCTZ and Cartia daily   Insomnia    OTC sleep aide   Lyme disease    hx of   MVP (mitral valve  prolapse)    per pt very minor.Echo done > 8 yrs ago   Nocturia    PONV (postoperative nausea and vomiting)    Post-nasal drip    takes Flonase daily    Family History  Problem Relation Age of Onset   Breast cancer Neg Hx     Past Surgical History:  Procedure Laterality Date   CERVICAL CONE BIOPSY     COLONOSCOPY     DILATION AND CURETTAGE OF UTERUS     GAS/FLUID EXCHANGE Left 12/25/2014   Procedure: GAS/FLUID EXCHANGE LEFT EYE;  Surgeon: Sherrie George, MD;  Location[facility];  Service: Ophthalmology;  Laterality: Left;   PARS PLANA VITRECTOMY Left 12/25/2014   laser, membrane peel, gas injection   PARS PLANA VITRECTOMY Left 12/25/2014   Procedure: PARS PLANA VITRECTOMY WITH 25 GAUGE LEFT EYE;  HEADSCOPE LASER;  Surgeon: Sherrie George, MD;  Location: [facility] OR;  Service: Ophthalmology;  Laterality:  Left;   SHOULDER ARTHROSCOPY Left    "to remove scar tissue"   thumb surgery Right 1963   "straighted out deformed thumb"   TONSILLECTOMY     Social History   Occupational History   Not on file  Tobacco Use   Smoking status: Former Smoker    Packs/day: 0.33    Years: 5.00    Pack years: 1.65    Types: Cigarettes   Smokeless tobacco: Never Used   Tobacco comment: quit smoking in the 1970's  Substance and Sexual Activity   Alcohol use: Yes    Alcohol/week: 14.0 standard drinks    Types: 7 Glasses of wine, 7 Shots of liquor per week   Drug use: No   Sexual activity: Never

## 2019-07-24 ENCOUNTER — Other Ambulatory Visit: Payer: Self-pay | Admitting: Family Medicine

## 2019-07-24 DIAGNOSIS — Z1231 Encounter for screening mammogram for malignant neoplasm of breast: Secondary | ICD-10-CM

## 2019-08-21 ENCOUNTER — Other Ambulatory Visit: Payer: Self-pay

## 2019-08-21 ENCOUNTER — Ambulatory Visit
Admission: RE | Admit: 2019-08-21 | Discharge: 2019-08-21 | Disposition: A | Discharge status: Home / Self Care | Payer: Medicare HMO | Source: Ambulatory Visit | Attending: Family Medicine | Admitting: Family Medicine

## 2019-08-21 DIAGNOSIS — Z1231 Encounter for screening mammogram for malignant neoplasm of breast: Secondary | ICD-10-CM

## 2019-10-29 DIAGNOSIS — J329 Chronic sinusitis, unspecified: Secondary | ICD-10-CM | POA: Diagnosis not present

## 2019-12-06 DIAGNOSIS — I10 Essential (primary) hypertension: Secondary | ICD-10-CM | POA: Diagnosis not present

## 2019-12-06 DIAGNOSIS — R7309 Other abnormal glucose: Secondary | ICD-10-CM | POA: Diagnosis not present

## 2019-12-06 DIAGNOSIS — K219 Gastro-esophageal reflux disease without esophagitis: Secondary | ICD-10-CM | POA: Diagnosis not present

## 2019-12-06 DIAGNOSIS — E782 Mixed hyperlipidemia: Secondary | ICD-10-CM | POA: Diagnosis not present

## 2019-12-06 DIAGNOSIS — E559 Vitamin D deficiency, unspecified: Secondary | ICD-10-CM | POA: Diagnosis not present

## 2019-12-06 DIAGNOSIS — J309 Allergic rhinitis, unspecified: Secondary | ICD-10-CM | POA: Diagnosis not present

## 2019-12-06 DIAGNOSIS — H3562 Retinal hemorrhage, left eye: Secondary | ICD-10-CM | POA: Diagnosis not present

## 2019-12-06 DIAGNOSIS — Z0000 Encounter for general adult medical examination without abnormal findings: Secondary | ICD-10-CM | POA: Diagnosis not present

## 2019-12-18 DIAGNOSIS — M810 Age-related osteoporosis without current pathological fracture: Secondary | ICD-10-CM | POA: Diagnosis not present

## 2019-12-18 DIAGNOSIS — I10 Essential (primary) hypertension: Secondary | ICD-10-CM | POA: Diagnosis not present

## 2019-12-18 DIAGNOSIS — E782 Mixed hyperlipidemia: Secondary | ICD-10-CM | POA: Diagnosis not present

## 2019-12-18 DIAGNOSIS — H3562 Retinal hemorrhage, left eye: Secondary | ICD-10-CM | POA: Diagnosis not present

## 2019-12-18 DIAGNOSIS — Z0000 Encounter for general adult medical examination without abnormal findings: Secondary | ICD-10-CM | POA: Diagnosis not present

## 2019-12-18 DIAGNOSIS — E559 Vitamin D deficiency, unspecified: Secondary | ICD-10-CM | POA: Diagnosis not present

## 2019-12-18 DIAGNOSIS — Z23 Encounter for immunization: Secondary | ICD-10-CM | POA: Diagnosis not present

## 2019-12-18 DIAGNOSIS — Z01419 Encounter for gynecological examination (general) (routine) without abnormal findings: Secondary | ICD-10-CM | POA: Diagnosis not present

## 2019-12-18 DIAGNOSIS — Z1389 Encounter for screening for other disorder: Secondary | ICD-10-CM | POA: Diagnosis not present

## 2020-01-02 ENCOUNTER — Other Ambulatory Visit: Payer: Self-pay | Admitting: Family Medicine

## 2020-01-02 DIAGNOSIS — M810 Age-related osteoporosis without current pathological fracture: Secondary | ICD-10-CM

## 2020-01-23 ENCOUNTER — Ambulatory Visit (INDEPENDENT_AMBULATORY_CARE_PROVIDER_SITE_OTHER): Payer: Medicare HMO

## 2020-01-23 ENCOUNTER — Ambulatory Visit: Payer: Medicare HMO | Admitting: Orthopedic Surgery

## 2020-01-23 DIAGNOSIS — M79605 Pain in left leg: Secondary | ICD-10-CM | POA: Diagnosis not present

## 2020-01-24 ENCOUNTER — Encounter: Payer: Self-pay | Admitting: Orthopedic Surgery

## 2020-01-24 NOTE — Progress Notes (Signed)
Office Visit Note   Patient: Alicia [name]           Date of Birth: Feb 02, 1945           MRN: [id number] Visit Date: 01/23/2020              Requested by: Gweneth Dimitri, MD [address] [place],  [place] [address] PCP: Gweneth Dimitri, MD  Chief Complaint  Patient presents with  . Left Leg - Pain      HPI: Patient is a 75 year old woman who was seen for initial evaluation for blunt trauma to the left leg.  She states that she struck her leg on a dog gate twice.  She has been using ice.  Injury occurred about 4 weeks ago.  Assessment & Plan: Visit Diagnoses:  1. Pain in left leg     Plan: Patient will be traveling to [place] recommended knee-high compression stockings recommended she continue her aspirin every other day.  Follow-Up Instructions: Return if symptoms worsen or fail to improve.   Ortho Exam  Patient is alert, oriented, no adenopathy, well-dressed, normal affect, normal respiratory effort. Examination patient's compartments are soft no evidence of compartment syndrome she has a good pulse she does have venous stasis swelling she does have some tenderness over the superficial veins in her left leg there is no cellulitis.  No pain in her left thigh there are no skin abrasions.  No pain with passive dorsiflexion of the ankle with the knee extended.  No evidence of a DVT.    Imaging: No results found. No images are attached to the encounter.  Labs: No results found for: HGBA1C, ESRSEDRATE, CRP, LABURIC, REPTSTATUS, GRAMSTAIN, CULT, LABORGA   No results found for: ALBUMIN, PREALBUMIN, LABURIC  No results found for: MG No results found for: VD25OH  No results found for: PREALBUMIN CBC EXTENDED Latest Ref Rng & Units 12/25/2014 06/09/2008  WBC 4.0 - 10.5 K/uL 4.4 -  RBC 3.87 - 5.11 MIL/uL 3.83(L) -  HGB 12.0 - 15.0 g/dL 11.9(L) 14.3  HCT 36.0 - 46.0 % 36.6 42.0  PLT 150 - 400 K/uL 250 -     There is no height or weight on file to calculate BMI.  Orders:   Orders Placed This Encounter  Procedures  . XR Tibia/Fibula Left   No orders of the defined types were placed in this encounter.    Procedures: No procedures performed  Clinical Data: No additional findings.  ROS:  All other systems negative, except as noted in the HPI. Review of Systems  Objective: Vital Signs: There were no vitals taken for this visit.  Specialty Comments:  No specialty comments available.  PMFS History: Patient Active Problem List   Diagnosis Date Noted  . Vitreous hemorrhage, left eye ([facility]) 12/25/2014   Past Medical History:  Diagnosis Date  . Bell's palsy   . Cataracts, bilateral    immature  . GERD (gastroesophageal reflux disease)    takes OMeprazole daily  . History of colon polyps    benign  . Hyperlipidemia    was on meds but has been off for over 6 months  . Hypertension    takes LIsinopril-HCTZ and [place] daily  . Insomnia    OTC sleep aide  . Lyme disease    hx of  . MVP (mitral valve prolapse)    per pt very minor.Echo done > 8 yrs ago  . Nocturia   . PONV (postoperative nausea and vomiting)   . Post-nasal  drip    takes Flonase daily    Family History  Problem Relation Age of Onset  . Breast cancer Neg Hx     Past Surgical History:  Procedure Laterality Date  . CERVICAL CONE BIOPSY    . COLONOSCOPY    . DILATION AND CURETTAGE OF UTERUS    . GAS/FLUID EXCHANGE Left 12/25/2014   Procedure: GAS/FLUID EXCHANGE LEFT EYE;  Surgeon: Sherrie George, MD;  Location: [facility] OR;  Service: Ophthalmology;  Laterality: Left;  . PARS PLANA VITRECTOMY Left 12/25/2014   laser, membrane peel, gas injection  . PARS PLANA VITRECTOMY Left 12/25/2014   Procedure: PARS PLANA VITRECTOMY WITH 25 GAUGE LEFT EYE;  HEADSCOPE LASER;  Surgeon: Sherrie George, MD;  Location: [facility] OR;  Service: Ophthalmology;  Laterality: Left;  . SHOULDER ARTHROSCOPY Left    "to remove scar tissue"  . thumb surgery Right 1963   "straighted out deformed thumb"  .  TONSILLECTOMY     Social History   Occupational History  . Not on file  Tobacco Use  . Smoking status: Former Smoker    Packs/day: 0.33    Years: 5.00    Pack years: 1.65    Types: Cigarettes  . Smokeless tobacco: Never Used  . Tobacco comment: quit smoking in the 1970's  Substance and Sexual Activity  . Alcohol use: Yes    Alcohol/week: 14.0 standard drinks    Types: 7 Glasses of wine, 7 Shots of liquor per week  . Drug use: No  . Sexual activity: Never

## 2020-02-05 ENCOUNTER — Encounter (INDEPENDENT_AMBULATORY_CARE_PROVIDER_SITE_OTHER): Payer: Medicare HMO | Admitting: Ophthalmology

## 2020-02-26 ENCOUNTER — Encounter (INDEPENDENT_AMBULATORY_CARE_PROVIDER_SITE_OTHER): Payer: Medicare HMO | Admitting: Ophthalmology

## 2020-02-26 ENCOUNTER — Other Ambulatory Visit: Payer: Self-pay

## 2020-02-26 DIAGNOSIS — H43811 Vitreous degeneration, right eye: Secondary | ICD-10-CM

## 2020-02-26 DIAGNOSIS — H35033 Hypertensive retinopathy, bilateral: Secondary | ICD-10-CM | POA: Diagnosis not present

## 2020-02-26 DIAGNOSIS — I10 Essential (primary) hypertension: Secondary | ICD-10-CM

## 2020-02-26 DIAGNOSIS — H35342 Macular cyst, hole, or pseudohole, left eye: Secondary | ICD-10-CM

## 2020-03-18 DIAGNOSIS — H52223 Regular astigmatism, bilateral: Secondary | ICD-10-CM | POA: Diagnosis not present

## 2020-03-18 DIAGNOSIS — I10 Essential (primary) hypertension: Secondary | ICD-10-CM | POA: Diagnosis not present

## 2020-03-18 DIAGNOSIS — H35033 Hypertensive retinopathy, bilateral: Secondary | ICD-10-CM | POA: Diagnosis not present

## 2020-03-19 DIAGNOSIS — E782 Mixed hyperlipidemia: Secondary | ICD-10-CM | POA: Diagnosis not present

## 2020-03-22 DIAGNOSIS — Z1211 Encounter for screening for malignant neoplasm of colon: Secondary | ICD-10-CM | POA: Diagnosis not present

## 2020-04-16 ENCOUNTER — Other Ambulatory Visit: Payer: Medicare HMO

## 2020-06-04 DIAGNOSIS — E782 Mixed hyperlipidemia: Secondary | ICD-10-CM | POA: Diagnosis not present

## 2020-07-19 ENCOUNTER — Other Ambulatory Visit: Payer: Self-pay | Admitting: Family Medicine

## 2020-07-19 DIAGNOSIS — Z1231 Encounter for screening mammogram for malignant neoplasm of breast: Secondary | ICD-10-CM

## 2020-07-29 ENCOUNTER — Ambulatory Visit: Payer: Medicare HMO | Admitting: Orthopedic Surgery

## 2020-09-13 ENCOUNTER — Other Ambulatory Visit: Payer: Self-pay

## 2020-09-13 ENCOUNTER — Ambulatory Visit
Admission: RE | Admit: 2020-09-13 | Discharge: 2020-09-13 | Disposition: A | Discharge status: Home / Self Care | Payer: Medicare HMO | Source: Ambulatory Visit | Attending: Family Medicine | Admitting: Family Medicine

## 2020-09-13 DIAGNOSIS — Z1231 Encounter for screening mammogram for malignant neoplasm of breast: Secondary | ICD-10-CM | POA: Diagnosis not present

## 2020-09-23 DIAGNOSIS — I10 Essential (primary) hypertension: Secondary | ICD-10-CM | POA: Diagnosis not present

## 2020-09-23 DIAGNOSIS — Z79899 Other long term (current) drug therapy: Secondary | ICD-10-CM | POA: Diagnosis not present

## 2020-09-23 DIAGNOSIS — R002 Palpitations: Secondary | ICD-10-CM | POA: Diagnosis not present

## 2020-09-26 ENCOUNTER — Ambulatory Visit
Admission: RE | Admit: 2020-09-26 | Discharge: 2020-09-26 | Disposition: A | Discharge status: Home / Self Care | Payer: Medicare HMO | Source: Ambulatory Visit | Attending: Family Medicine | Admitting: Family Medicine

## 2020-09-26 ENCOUNTER — Other Ambulatory Visit: Payer: Self-pay

## 2020-09-26 DIAGNOSIS — Z780 Asymptomatic menopausal state: Secondary | ICD-10-CM | POA: Diagnosis not present

## 2020-09-26 DIAGNOSIS — M810 Age-related osteoporosis without current pathological fracture: Secondary | ICD-10-CM

## 2020-09-26 DIAGNOSIS — M8589 Other specified disorders of bone density and structure, multiple sites: Secondary | ICD-10-CM | POA: Diagnosis not present

## 2020-10-02 ENCOUNTER — Encounter: Payer: Self-pay | Admitting: Orthopedic Surgery

## 2020-10-07 ENCOUNTER — Ambulatory Visit: Payer: Medicare HMO | Admitting: Orthopedic Surgery

## 2020-10-07 ENCOUNTER — Ambulatory Visit: Payer: Self-pay

## 2020-10-07 ENCOUNTER — Other Ambulatory Visit: Payer: Self-pay

## 2020-10-07 DIAGNOSIS — M25552 Pain in left hip: Secondary | ICD-10-CM

## 2020-10-07 DIAGNOSIS — M8580 Other specified disorders of bone density and structure, unspecified site: Secondary | ICD-10-CM | POA: Diagnosis not present

## 2020-10-07 DIAGNOSIS — Z79899 Other long term (current) drug therapy: Secondary | ICD-10-CM | POA: Diagnosis not present

## 2020-11-05 ENCOUNTER — Encounter: Payer: Self-pay | Admitting: Orthopedic Surgery

## 2020-11-05 NOTE — Progress Notes (Signed)
Office Visit Note   Patient: Alicia [name]           Date of Birth: 10-20-1945           MRN: [id number] Visit Date: 10/07/2020              Requested by: Gweneth Dimitri, MD [address] [place],  [place] [address] PCP: Gweneth Dimitri, MD  Chief Complaint  Patient presents with   Left Hip - Pain      HPI: Patient is a 75 year old woman who presents for evaluation of her left hip she has had some left hip pain.  Recently she has had a bone mineral density study that showed the femoral neck T score was -2.5.  Assessment & Plan: Visit Diagnoses:  1. Decreased bone density   2. Pain in left hip     Plan: Patient is currently taking 2000 international units of vitamin D3 daily plus calcium.  Recommended weight training exercising such as hopping.  Follow-Up Instructions: Return if symptoms worsen or fail to improve.   Ortho Exam  Patient is alert, oriented, no adenopathy, well-dressed, normal affect, normal respiratory effort. Examination patient has no pain with range of motion of her hip.  Radiographs shows a congruent joint space without fracture.  Patient's bone mineral density is consistent with osteoporosis.  Imaging: No results found. No images are attached to the encounter.  Labs: No results found for: HGBA1C, ESRSEDRATE, CRP, LABURIC, REPTSTATUS, GRAMSTAIN, CULT, LABORGA   No results found for: ALBUMIN, PREALBUMIN, CBC  No results found for: MG No results found for: VD25OH  No results found for: PREALBUMIN CBC EXTENDED Latest Ref Rng & Units 12/25/2014 06/09/2008  WBC 4.0 - 10.5 K/uL 4.4 -  RBC 3.87 - 5.11 MIL/uL 3.83(L) -  HGB 12.0 - 15.0 g/dL 11.9(L) 14.3  HCT 36.0 - 46.0 % 36.6 42.0  PLT 150 - 400 K/uL 250 -     There is no height or weight on file to calculate BMI.  Orders:  Orders Placed This Encounter  Procedures   XR HIP UNILAT W OR W/O PELVIS 2-3 VIEWS LEFT   No orders of the defined types were placed in this encounter.     Procedures: No procedures performed  Clinical Data: No additional findings.  ROS:  All other systems negative, except as noted in the HPI. Review of Systems  Objective: Vital Signs: There were no vitals taken for this visit.  Specialty Comments:  No specialty comments available.  PMFS History: Patient Active Problem List   Diagnosis Date Noted   Vitreous hemorrhage, left eye ([facility]) 12/25/2014   Past Medical History:  Diagnosis Date   Bell's palsy    Cataracts, bilateral    immature   GERD (gastroesophageal reflux disease)    takes OMeprazole daily   History of colon polyps    benign   Hyperlipidemia    was on meds but has been off for over 6 months   Hypertension    takes LIsinopril-HCTZ and Cartia daily   Insomnia    OTC sleep aide   Lyme disease    hx of   MVP (mitral valve prolapse)    per pt very minor.Echo done > 8 yrs ago   Nocturia    PONV (postoperative nausea and vomiting)    Post-nasal drip    takes Flonase daily    Family History  Problem Relation Age of Onset   Breast cancer Neg Hx     Past Surgical  History:  Procedure Laterality Date   CERVICAL CONE BIOPSY     COLONOSCOPY     DILATION AND CURETTAGE OF UTERUS     GAS/FLUID EXCHANGE Left 12/25/2014   Procedure: GAS/FLUID EXCHANGE LEFT EYE;  Surgeon: Sherrie George, MD;  [facility] OR;  Service: Ophthalmology;  Laterality: Left;   PARS PLANA VITRECTOMY Left 12/25/2014   laser, membrane peel, gas injection   PARS PLANA VITRECTOMY Left 12/25/2014   Procedure: PARS PLANA VITRECTOMY WITH 25 GAUGE LEFT EYE;  HEADSCOPE LASER;  Surgeon: Sherrie George, MD;  Location: [facility] OR;  Service: Ophthalmology;  Laterality: Left;   SHOULDER ARTHROSCOPY Left    "to remove scar tissue"   thumb surgery Right 1963   "straighted out deformed thumb"   TONSILLECTOMY     Social History   Occupational History   Not on file  Tobacco Use   Smoking status: Former    Packs/day: 0.33    Years: 5.00    Pack years:  1.65    Types: Cigarettes   Smokeless tobacco: Never   Tobacco comments:    quit smoking in the 1970's  Substance and Sexual Activity   Alcohol use: Yes    Alcohol/week: 14.0 standard drinks    Types: 7 Glasses of wine, 7 Shots of liquor per week   Drug use: No   Sexual activity: Never

## 2020-12-02 ENCOUNTER — Other Ambulatory Visit: Payer: Self-pay

## 2020-12-02 ENCOUNTER — Ambulatory Visit: Payer: Medicare HMO | Admitting: Orthopedic Surgery

## 2020-12-02 ENCOUNTER — Ambulatory Visit (INDEPENDENT_AMBULATORY_CARE_PROVIDER_SITE_OTHER): Payer: Medicare HMO

## 2020-12-02 DIAGNOSIS — M79671 Pain in right foot: Secondary | ICD-10-CM | POA: Diagnosis not present

## 2020-12-02 DIAGNOSIS — N362 Urethral caruncle: Secondary | ICD-10-CM | POA: Diagnosis not present

## 2020-12-02 DIAGNOSIS — R350 Frequency of micturition: Secondary | ICD-10-CM | POA: Diagnosis not present

## 2020-12-02 DIAGNOSIS — M6701 Short Achilles tendon (acquired), right ankle: Secondary | ICD-10-CM

## 2020-12-11 ENCOUNTER — Encounter: Payer: Self-pay | Admitting: Orthopedic Surgery

## 2020-12-11 NOTE — Progress Notes (Signed)
Office Visit Note   Patient: Alicia [name]           Date of Birth: 07-01-1945           MRN: [id number] Visit Date: 12/02/2020              Requested by: Cari Caraway, [place],  [place] [address] PCP: Cari Caraway, MD  Chief Complaint  Patient presents with   Right Foot - Pain      HPI: Patient is a 75 year old woman who presents complaining of right heel pain which she describes a pulling sensation in the Achilles and plantar fascia.  She states she has pain with start up pain with walking.  Assessment & Plan: Visit Diagnoses:  1. Pain in right foot   2. Contracture of right Achilles tendon     Plan: Patient was given instructions and demonstrated Achilles stretching to do 5 times a day recommended stiff soled shoes and Voltaren gel to the plantar fascia and Achilles 3 times a day.  Follow-Up Instructions: Return if symptoms worsen or fail to improve.   Ortho Exam  Patient is alert, oriented, no adenopathy, well-dressed, normal affect, normal respiratory effort. Examination patient has good pulse with her knee extended she has dorsiflexion to neutral she has tenderness to palpation over the entire plantar fashion of the Achilles is not tender to palpation there are no nodules.  Imaging: No results found. No images are attached to the encounter.  Labs: No results found for: HGBA1C, ESRSEDRATE, CRP, LABURIC, REPTSTATUS, GRAMSTAIN, CULT, LABORGA   No results found for: ALBUMIN, PREALBUMIN, CBC  No results found for: MG No results found for: VD25OH  No results found for: PREALBUMIN CBC EXTENDED Latest Ref Rng & Units 12/25/2014 06/09/2008  WBC 4.0 - 10.5 K/uL 4.4 -  RBC 3.87 - 5.11 MIL/uL 3.83(L) -  HGB 12.0 - 15.0 g/dL 11.9(L) 14.3  HCT 36.0 - 46.0 % 36.6 42.0  PLT 150 - 400 K/uL 250 -     There is no height or weight on file to calculate BMI.  Orders:  Orders Placed This Encounter  Procedures   XR Foot 2 Views Right   No orders of  the defined types were placed in this encounter.    Procedures: No procedures performed  Clinical Data: No additional findings.  ROS:  All other systems negative, except as noted in the HPI. Review of Systems  Objective: Vital Signs: There were no vitals taken for this visit.  Specialty Comments:  No specialty comments available.  PMFS History: Patient Active Problem List   Diagnosis Date Noted   Vitreous hemorrhage, left eye ([place]) 12/25/2014   Past Medical History:  Diagnosis Date   Bell's palsy    Cataracts, bilateral    immature   GERD (gastroesophageal reflux disease)    takes OMeprazole daily   History of colon polyps    benign   Hyperlipidemia    was on meds but has been off for over 6 months   Hypertension    takes LIsinopril-HCTZ and Cartia daily   Insomnia    OTC sleep aide   Lyme disease    hx of   MVP (mitral valve prolapse)    per pt very minor.Echo done > 8 yrs ago   Nocturia    PONV (postoperative nausea and vomiting)    Post-nasal drip    takes Flonase daily    Family History  Problem Relation Age of Onset  Breast cancer Neg Hx     Past Surgical History:  Procedure Laterality Date   CERVICAL CONE BIOPSY     COLONOSCOPY     DILATION AND CURETTAGE OF UTERUS     GAS/FLUID EXCHANGE Left 12/25/2014   Procedure: GAS/FLUID EXCHANGE LEFT EYE;  Surgeon: Sherrie George, MD;  Location: [facility] OR;  Service: Ophthalmology;  Laterality: Left;   PARS PLANA VITRECTOMY Left 12/25/2014   laser, membrane peel, gas injection   PARS PLANA VITRECTOMY Left 12/25/2014   Procedure: PARS PLANA VITRECTOMY WITH 25 GAUGE LEFT EYE;  HEADSCOPE LASER;  Surgeon: Sherrie George, MD;  Location: [facility] OR;  Service: Ophthalmology;  Laterality: Left;   SHOULDER ARTHROSCOPY Left    "to remove scar tissue"   thumb surgery Right 1963   "straighted out deformed thumb"   TONSILLECTOMY     Social History   Occupational History   Not on file  Tobacco Use   Smoking status:  Former    Packs/day: 0.33    Years: 5.00    Pack years: 1.65    Types: Cigarettes   Smokeless tobacco: Never   Tobacco comments:    quit smoking in the 1970's  Substance and Sexual Activity   Alcohol use: Yes    Alcohol/week: 14.0 standard drinks    Types: 7 Glasses of wine, 7 Shots of liquor per week   Drug use: No   Sexual activity: Never

## 2020-12-16 DIAGNOSIS — E782 Mixed hyperlipidemia: Secondary | ICD-10-CM | POA: Diagnosis not present

## 2020-12-16 DIAGNOSIS — I10 Essential (primary) hypertension: Secondary | ICD-10-CM | POA: Diagnosis not present

## 2020-12-16 DIAGNOSIS — M85852 Other specified disorders of bone density and structure, left thigh: Secondary | ICD-10-CM | POA: Diagnosis not present

## 2020-12-16 DIAGNOSIS — M85851 Other specified disorders of bone density and structure, right thigh: Secondary | ICD-10-CM | POA: Diagnosis not present

## 2020-12-16 DIAGNOSIS — R002 Palpitations: Secondary | ICD-10-CM | POA: Diagnosis not present

## 2020-12-16 DIAGNOSIS — Z8679 Personal history of other diseases of the circulatory system: Secondary | ICD-10-CM | POA: Diagnosis not present

## 2020-12-23 DIAGNOSIS — Z1389 Encounter for screening for other disorder: Secondary | ICD-10-CM | POA: Diagnosis not present

## 2020-12-23 DIAGNOSIS — Z0000 Encounter for general adult medical examination without abnormal findings: Secondary | ICD-10-CM | POA: Diagnosis not present

## 2020-12-24 DIAGNOSIS — K219 Gastro-esophageal reflux disease without esophagitis: Secondary | ICD-10-CM | POA: Diagnosis not present

## 2020-12-24 DIAGNOSIS — I10 Essential (primary) hypertension: Secondary | ICD-10-CM | POA: Diagnosis not present

## 2020-12-24 DIAGNOSIS — E782 Mixed hyperlipidemia: Secondary | ICD-10-CM | POA: Diagnosis not present

## 2020-12-24 DIAGNOSIS — J309 Allergic rhinitis, unspecified: Secondary | ICD-10-CM | POA: Diagnosis not present

## 2020-12-24 DIAGNOSIS — M810 Age-related osteoporosis without current pathological fracture: Secondary | ICD-10-CM | POA: Diagnosis not present

## 2020-12-24 DIAGNOSIS — N952 Postmenopausal atrophic vaginitis: Secondary | ICD-10-CM | POA: Diagnosis not present

## 2020-12-24 DIAGNOSIS — R002 Palpitations: Secondary | ICD-10-CM | POA: Diagnosis not present

## 2020-12-24 DIAGNOSIS — Z131 Encounter for screening for diabetes mellitus: Secondary | ICD-10-CM | POA: Diagnosis not present

## 2020-12-30 NOTE — Progress Notes (Signed)
Date:  12/31/2020   ID:  Alicia [name], DOB September 09, 1945, MRN [id number]  PCP:  Cari Caraway, MD  Cardiologist:  Rex Kras, DO, [facility] (established care 12/31/2020)  REASON FOR CONSULT: Palpitations  REQUESTING PHYSICIAN:  Cari Caraway, MD [place],  [place] [id number]  Chief Complaint  Patient presents with   Palpitations   New Patient (Initial Visit)    HPI  Alicia [name] is a 75 y.o. Caucasian female who presents to the office with a chief complaint of " palpitations." Patient's past medical history and cardiovascular risk factors include: Benign essential hypertension, mixed hyperlipidemia, osteoporosis, GERD, chronic constipation, Lyme's disease in 2004, documented mitral valve prolapse, former smoker, postmenopausal female, advanced age.   She is referred to the office at the request of Cari Caraway, MD for evaluation of palpitations.   Patient states that she was diagnosed with mitral valve prolapse at least 20 years ago or more by echocardiogram.  Patient states that she first had episodes of palpitations approximately 35 years ago when her daughter was 11 years old who is now 27 years of age.  At which time she had gone to the hospital and received IV medications which resolved her underlying arrhythmia.  Thereafter she was started on diltiazem and since then has been on the same dose.  Patient states that during November 2022 she was under a lot of stress at work and then 12/04/2020 after getting home she fell asleep and woke up abruptly when she heard a loud noise and thought it was a fire alarm ringing; however, this was not occuring.  Subsequently started noticing elevated heart rate/palpitation.  Patient states that she sat down and did her rosary which lasted for about 45 minutes and thereafter her symptoms had resolved.  No reoccurrence of palpitations since that event.  She denies any exertional syncope.  Overall functional status remains stable.  She  lives in a condo on a third floor and goes up and down the stairs frequently.  During the summer months and does water aerobics very frequently and continues to work part-time at age of 60.  She regularly has 1 shot of vodka/tonic every night.  FUNCTIONAL STATUS: Enjoys water aerobic all summer long and walks regularly. Works part-time at age of 44.    ALLERGIES: Allergies  Allergen Reactions   Augmentin [Amoxicillin-Pot Clavulanate] Nausea And Vomiting   Codeine Nausea And Vomiting    MEDICATION LIST PRIOR TO VISIT: Current Meds  Medication Sig   aspirin EC 81 MG tablet Take 81 mg by mouth 3 (three) times a week. Monday, Wednesday, Friday   Calcium Carb-Cholecalciferol 600-200 MG-UNIT TABS Take 1 tablet by mouth daily.   CARTIA XT 180 MG 24 hr capsule Take 180 mg by mouth daily.   doxylamine, Sleep, (UNISOM) 25 MG tablet Take 25 mg by mouth at bedtime as needed for sleep.   ibuprofen (ADVIL,MOTRIN) 200 MG tablet Take 400 mg by mouth every 6 (six) hours as needed (pain).   lisinopril-hydrochlorothiazide (PRINZIDE,ZESTORETIC) 20-12.5 MG tablet Take 1 tablet by mouth daily.   omeprazole (PRILOSEC) 20 MG capsule Take 20 mg by mouth daily.   rosuvastatin (CRESTOR) 10 MG tablet      PAST MEDICAL HISTORY: Past Medical History:  Diagnosis Date   Bell's palsy    Cataracts, bilateral    immature   GERD (gastroesophageal reflux disease)    takes OMeprazole daily   History of colon polyps    benign   Hyperlipidemia  was on meds but has been off for over 6 months   Hypertension    takes LIsinopril-HCTZ and Cartia daily   Insomnia    OTC sleep aide   Lyme disease    hx of   MVP (mitral valve prolapse)    per pt very minor.Echo done > 8 yrs ago   Nocturia    PONV (postoperative nausea and vomiting)    Post-nasal drip    takes Flonase daily    PAST SURGICAL HISTORY: Past Surgical History:  Procedure Laterality Date   CERVICAL CONE BIOPSY     COLONOSCOPY     DILATION AND  CURETTAGE OF UTERUS     GAS/FLUID EXCHANGE Left 12/25/2014   Procedure: GAS/FLUID EXCHANGE LEFT EYE;  Surgeon: Hayden Pedro, MD;  Location: [facility];  Service: Ophthalmology;  Laterality: Left;   PARS PLANA VITRECTOMY Left 12/25/2014   laser, membrane peel, gas injection   PARS PLANA VITRECTOMY Left 12/25/2014   Procedure: PARS PLANA VITRECTOMY WITH 25 GAUGE LEFT EYE;  HEADSCOPE LASER;  Surgeon: Hayden Pedro, MD;  Location: [place];  Service: Ophthalmology;  Laterality: Left;   SHOULDER ARTHROSCOPY Left    "to remove scar tissue"   thumb surgery Right 1963   "straighted out deformed thumb"   TONSILLECTOMY      FAMILY HISTORY: The patient family history is not on file.  SOCIAL HISTORY:  The patient  reports that she has quit smoking. Her smoking use included cigarettes. She has a 1.65 pack-year smoking history. She has never used smokeless tobacco. She reports current alcohol use of about 1.0 - 3.0 standard drink per week. She reports that she does not use drugs.  REVIEW OF SYSTEMS: Review of Systems  Constitutional: Negative for chills and fever.  HENT:  Negative for hoarse voice and nosebleeds.   Eyes:  Negative for discharge, double vision and pain.  Cardiovascular:  Positive for palpitations. Negative for chest pain, claudication, dyspnea on exertion, leg swelling, near-syncope, orthopnea, paroxysmal nocturnal dyspnea and syncope.  Respiratory:  Negative for hemoptysis and shortness of breath.   Musculoskeletal:  Negative for muscle cramps and myalgias.  Gastrointestinal:  Negative for abdominal pain, constipation, diarrhea, hematemesis, hematochezia, melena, nausea and vomiting.  Neurological:  Negative for dizziness and light-headedness.   PHYSICAL EXAM: Vitals with BMI 12/31/2020 07/03/2019 05/30/2019  Height 5\' 3"  5\' 3"  5\' 3"   Weight 128 lbs 3 oz 129 lbs 129 lbs  BMI 22.72 [id number]  Systolic 0000000 - -  Diastolic 72 - -  Pulse 80 - -    CONSTITUTIONAL: Well-developed  and well-nourished. No acute distress.  SKIN: Skin is warm and dry. No rash noted. No cyanosis. No pallor. No jaundice HEAD: Normocephalic and atraumatic.  EYES: No scleral icterus MOUTH/THROAT: Moist oral membranes.  NECK: No JVD present. No thyromegaly noted. No carotid bruits  LYMPHATIC: No visible cervical adenopathy.  CHEST Normal respiratory effort. No intercostal retractions  LUNGS: Clear to auscultation bilaterally.  No stridor. No wheezes. No rales.  CARDIOVASCULAR: Regular rate and rhythm, positive S1-S2, no murmurs rubs or gallops appreciated. ABDOMINAL: Nonobese, soft, nontender, nondistended, positive bowel sounds in all 4 quadrants, no apparent ascites.  EXTREMITIES: No peripheral edema, warm to touch, 2+ bilateral DP and PT pulses HEMATOLOGIC: No significant bruising NEUROLOGIC: Oriented to person, place, and time. Nonfocal. Normal muscle tone.  PSYCHIATRIC: Normal mood and affect. Normal behavior. Cooperative  CARDIAC DATABASE: EKG: 12/20/202: NSR, 75bpm, normal axis, without underlying injury pattern.   Echocardiogram: No  results found for this or any previous visit from the past 1095 days.    Stress Testing: No results found for this or any previous visit from the past 1095 days.   Heart Catheterization: None  LABORATORY DATA: CBC Latest Ref Rng & Units 12/25/2014 06/09/2008  WBC 4.0 - 10.5 K/uL 4.4 -  Hemoglobin 12.0 - 15.0 g/dL 11.9(L) 14.3  Hematocrit 36.0 - 46.0 % 36.6 42.0  Platelets 150 - 400 K/uL 250 -    CMP Latest Ref Rng & Units 12/25/2014 06/09/2008  Glucose 65 - 99 mg/dL 496(P) 591(M)  BUN 6 - 20 mg/dL 38(G) 20  Creatinine 6.65 - 1.00 mg/dL 9.93 0.7  Sodium 570 - 145 mmol/L 137 137  Potassium 3.5 - 5.1 mmol/L 4.2 3.4(L)  Chloride 101 - 111 mmol/L 102 105  CO2 22 - 32 mmol/L 26 -  Calcium 8.9 - 10.3 mg/dL 9.6 -    Lipid Panel  No results found for: CHOL, TRIG, HDL, CHOLHDL, VLDL, LDLCALC, LDLDIRECT, LABVLDL  No components found for:  NTPROBNP No results for input(s): PROBNP in the last 8760 hours. No results for input(s): TSH in the last 8760 hours.  BMP No results for input(s): NA, K, CL, CO2, GLUCOSE, BUN, CREATININE, CALCIUM, GFRNONAA, GFRAA in the last 8760 hours.  HEMOGLOBIN A1C No results found for: HGBA1C, MPG  External Labs: Collected: 10/07/2020: Provided by PCP via [facility]. BUN 17, creatinine 0.77. Sodium 138, potassium 4.2, chloride 105, bicarb 27  09/23/2020 TSH 2.47 Hemoglobin 13.1 g/dL, hematocrit 17.7%  IMPRESSION:    ICD-10-CM   1. Palpitations  R00.2 EKG 12-Lead    LONG TERM MONITOR (3-14 DAYS)    2. Mitral valve prolapse  I34.1 PCV ECHOCARDIOGRAM COMPLETE    3. Benign hypertension  I10 PCV ECHOCARDIOGRAM COMPLETE    4. Mixed hyperlipidemia  E78.2        RECOMMENDATIONS: Alicia [name] is a 75 y.o. Caucasian female whose past medical history and cardiac risk factors include: Benign essential hypertension, mixed hyperlipidemia, osteoporosis, GERD, chronic constipation, Lyme's disease in 2004, documented mitral valve prolapse, former smoker, postmenopausal female, advanced age.   Patient presents to the office for evaluation of palpitations that occurred on 12/04/2020.  Based on his symptoms and history of present illness does appear to be situational (increased stress at work) without any reoccurrences.  She has been symptom-free since 12/04/2020.  We discussed ordering an extended Holter monitor but since her symptoms are not present the overall yield may be low.  However the shared decision was to order the extended Holter monitor for 14 days and if she were to have recurrence of symptoms she will call the office and have it scheduled.  Otherwise monitor for now.  Patient does carry history of mitral valve prolapse.  On physical examination no significant murmur auscultated.  However, we will repeat an echocardiogram to reevaluate the presence of MVP and the associated MR.  Patient  states that her blood pressures were elevated and recently has been started on lisinopril by her PCP.  Her blood pressures today are very well controlled which may also impact the severity of the underlying MR.   If she has recurrence of such events.  Patient is asked to call a friend or family prior to doing Valsalva maneuver or bearing down to terminate such episodes.  If she has increased frequency would also recommend up titration of diltiazem.  For now we will continue to monitor her symptoms and reevaluate as needed.  Patient is  thankful for the care and the recommendations provided.  She is agreeable with the plan of care.  Thank you for allowing [place] to participate in the care of Alicia [name] please reach out if any questions or concerns arise.  As part of this initial consultation reviewed outside records provided by PCP via [facility] which included office note,  labs these findings have been summarized and noted above for further reference.  Discussed disease management, ordering diagnostic testing, coordination of care and patient education provided as a part of today's encounter.  FINAL MEDICATION LIST END OF ENCOUNTER: No orders of the defined types were placed in this encounter.   Medications Discontinued During This Encounter  Medication Reason   prednisoLONE acetate (PRED FORTE) 1 % ophthalmic suspension    Magnesium 250 MG TABS    gatifloxacin (ZYMAXID) 0.5 % SOLN    fluticasone (FLONASE) 50 MCG/ACT nasal spray    bacitracin-polymyxin b (POLYSPORIN) ophthalmic ointment      Current Outpatient Medications:    aspirin EC 81 MG tablet, Take 81 mg by mouth 3 (three) times a week. Monday, Wednesday, Friday, Disp: , Rfl:    Calcium Carb-Cholecalciferol 600-200 MG-UNIT TABS, Take 1 tablet by mouth daily., Disp: , Rfl:    CARTIA XT 180 MG 24 hr capsule, Take 180 mg by mouth daily., Disp: , Rfl:    doxylamine, Sleep, (UNISOM) 25 MG tablet, Take 25 mg by mouth at bedtime as needed  for sleep., Disp: , Rfl:    ibuprofen (ADVIL,MOTRIN) 200 MG tablet, Take 400 mg by mouth every 6 (six) hours as needed (pain)., Disp: , Rfl:    lisinopril-hydrochlorothiazide (PRINZIDE,ZESTORETIC) 20-12.5 MG tablet, Take 1 tablet by mouth daily., Disp: , Rfl:    omeprazole (PRILOSEC) 20 MG capsule, Take 20 mg by mouth daily., Disp: , Rfl:    rosuvastatin (CRESTOR) 10 MG tablet, , Disp: , Rfl:   Orders Placed This Encounter  Procedures   LONG TERM MONITOR (3-14 DAYS)   EKG 12-Lead   PCV ECHOCARDIOGRAM COMPLETE    There are no Patient Instructions on file for this visit.   --Continue cardiac medications as reconciled in final medication list. --Return in about 1 year (around 12/31/2021) for Follow up MVP, palpitations. Or sooner if needed. --Continue follow-up with your primary care physician regarding the management of your other chronic comorbid conditions.  Patient's questions and concerns were addressed to her satisfaction. She voices understanding of the instructions provided during this encounter.   This note was created using a voice recognition software as a result there may be grammatical errors inadvertently enclosed that do not reflect the nature of this encounter. Every attempt is made to correct such errors.  Rex Kras, [place], [facility]  Pager: [phone] Office: [phone]

## 2020-12-31 ENCOUNTER — Encounter: Payer: Self-pay | Admitting: Cardiology

## 2020-12-31 ENCOUNTER — Ambulatory Visit: Payer: Medicare HMO | Admitting: Cardiology

## 2020-12-31 ENCOUNTER — Other Ambulatory Visit: Payer: Self-pay

## 2020-12-31 VITALS — BP 128/72 | HR 80 | Resp 16 | Ht 63.0 in | Wt 128.2 lb

## 2020-12-31 DIAGNOSIS — I10 Essential (primary) hypertension: Secondary | ICD-10-CM

## 2020-12-31 DIAGNOSIS — I341 Nonrheumatic mitral (valve) prolapse: Secondary | ICD-10-CM | POA: Diagnosis not present

## 2020-12-31 DIAGNOSIS — E782 Mixed hyperlipidemia: Secondary | ICD-10-CM | POA: Diagnosis not present

## 2020-12-31 DIAGNOSIS — R002 Palpitations: Secondary | ICD-10-CM

## 2021-01-02 ENCOUNTER — Ambulatory Visit: Payer: Medicare HMO

## 2021-01-08 ENCOUNTER — Other Ambulatory Visit: Payer: Self-pay

## 2021-01-08 ENCOUNTER — Ambulatory Visit: Payer: Medicare HMO

## 2021-01-08 DIAGNOSIS — I10 Essential (primary) hypertension: Secondary | ICD-10-CM

## 2021-01-08 DIAGNOSIS — I341 Nonrheumatic mitral (valve) prolapse: Secondary | ICD-10-CM | POA: Diagnosis not present

## 2021-01-14 NOTE — Progress Notes (Signed)
Patient is aware of results.

## 2021-01-15 ENCOUNTER — Ambulatory Visit: Payer: Medicare HMO | Attending: Family Medicine

## 2021-01-15 ENCOUNTER — Other Ambulatory Visit: Payer: Self-pay

## 2021-01-15 DIAGNOSIS — R2681 Unsteadiness on feet: Secondary | ICD-10-CM | POA: Insufficient documentation

## 2021-01-15 DIAGNOSIS — M6281 Muscle weakness (generalized): Secondary | ICD-10-CM | POA: Insufficient documentation

## 2021-01-15 NOTE — Therapy (Signed)
OUTPATIENT PHYSICAL THERAPY LOWER EXTREMITY EVALUATION   Patient Name: Alicia [name] MRN: [id number] DOB:1945-12-15, 76 y.o., female Today's Date: 01/15/2021   PT End of Session - 01/15/21 1307     Visit Number 1    Number of Visits 3    Date for PT Re-Evaluation 02/26/21    Authorization Type [employer] MCR    Progress Note Due on Visit 3    PT Start Time 1315    PT Stop Time 1400    PT Time Calculation (min) 45 min    Activity Tolerance Patient tolerated treatment well    Behavior During Therapy WFL for tasks assessed/performed             Past Medical History:  Diagnosis Date   Bell's palsy    Cataracts, bilateral    immature   GERD (gastroesophageal reflux disease)    takes OMeprazole daily   History of colon polyps    benign   Hyperlipidemia    was on meds but has been off for over 6 months   Hypertension    takes LIsinopril-HCTZ and Cartia daily   Insomnia    OTC sleep aide   Lyme disease    hx of   MVP (mitral valve prolapse)    per pt very minor.Echo done > 8 yrs ago   Nocturia    PONV (postoperative nausea and vomiting)    Post-nasal drip    takes Flonase daily   Past Surgical History:  Procedure Laterality Date   CERVICAL CONE BIOPSY     COLONOSCOPY     DILATION AND CURETTAGE OF UTERUS     GAS/FLUID EXCHANGE Left 12/25/2014   Procedure: GAS/FLUID EXCHANGE LEFT EYE;  Surgeon: Sherrie GeorgeJohn D Matthews, MD;  [facility];  Service: Ophthalmology;  Laterality: Left;   PARS PLANA VITRECTOMY Left 12/25/2014   laser, membrane peel, gas injection   PARS PLANA VITRECTOMY Left 12/25/2014   Procedure: PARS PLANA VITRECTOMY WITH 25 GAUGE LEFT EYE;  HEADSCOPE LASER;  Surgeon: Sherrie GeorgeJohn D Matthews, MD;  [facility];  Service: Ophthalmology;  Laterality: Left;   SHOULDER ARTHROSCOPY Left    "to remove scar tissue"   thumb surgery Right 1963   "straighted out deformed thumb"   TONSILLECTOMY     Patient Active Problem List   Diagnosis Date Noted   Vitreous hemorrhage,  left eye (HCC) 12/25/2014    PCP: Gweneth DimitriMcNeill, Wendy, MD  REFERRING PROVIDER: Gweneth DimitriMcNeill, Wendy, MD  REFERRING DIAG: [phone] (ICD-10-CM) - Other specified disorders of bone density and structure, right thigh   THERAPY DIAG:  Unsteadiness on feet  Muscle weakness (generalized)  ONSET DATE: 12/16/2020   SUBJECTIVE:   SUBJECTIVE STATEMENT: Describes issues with L hip bone density loss affecting L hip  PERTINENT HISTORY: Decreased bone density in L thigh  PAIN:  Are you having pain? No VAS scale: 0/10   PRECAUTIONS: None  WEIGHT BEARING RESTRICTIONS No  FALLS:  Has patient fallen in last 6 months? N Number of falls: 0  LIVING ENVIRONMENT: Lives with: lives with their family and lives with their spouse Lives in: House/apartment   OCCUPATION: retired  PLOF: Independent  PATIENT GOALS To reduce bone density    OBJECTIVE:   DIAGNOSTIC FINDINGS: Decreased bone density   PATIENT SURVEYS:  FOTO 97(61 adjusted)  COGNITION:  Overall cognitive status: Within functional limits for tasks assessed       MUSCLE LENGTH: Hamstrings: negative Thomas test: negative  POSTURE:  WFL  PALPATION: Unremarkable   LE  AROM/PROM:  A/PROM Right 01/15/2021 Left 01/15/2021  Hip flexion WNL WNL  Hip extension    Hip abduction    Hip adduction    Hip internal rotation    Hip external rotation    Knee flexion    Knee extension    Ankle dorsiflexion    Ankle plantarflexion    Ankle inversion    Ankle eversion     (WFL throughout)  LE MMT:  MMT Right 01/15/2021 Left 01/15/2021  Hip flexion    Hip extension    Hip abduction    Hip adduction    Hip internal rotation    Hip external rotation    Knee flexion    Knee extension    Ankle dorsiflexion    Ankle plantarflexion    Ankle inversion    Ankle eversion    (WFL throughout)  LOWER EXTREMITY SPECIAL TESTS:  Hip special tests: Luisa Hart (FABER) test: negative and Thomas test: negative  FUNCTIONAL TESTS:  5 STS  7s  GAIT: Distance walked: 200 Assistive device utilized: None Level of assistance: Complete Independence     TODAY'S TREATMENT: Eval and HEP   PATIENT EDUCATION:  Education details: Discussed eval findings, rehab rationale and POC and patient is in agreement  Person educated: Patient Education method: Explanation Education comprehension: verbalized understanding   HOME EXERCISE PROGRAM: Access Code: [email or web address] URL: [email or web address] Date: 01/15/2021 Prepared by: Gustavus Bryant  Exercises Supine Bridge with Resistance Band - 2 x daily - 7 x weekly - 1 sets - 30 reps Seated Hamstring Stretch - 2 x daily - 7 x weekly - 1 sets - 3 reps - 30s hold Supine Bridge with Mini Swiss Ball Between Knees - 2 x daily - 7 x weekly - 1 sets - 30 reps Sit to Stand with Arms Crossed - 2 x daily - 7 x weekly - 1 sets - 10 reps   ASSESSMENT:  CLINICAL IMPRESSION: Patient is a 76 y.o. female who was seen today for physical therapy evaluation and treatment for establishment of a HEP to address underlying osteoporosis. Patient demos good overall LE ROM and strength, functional testing unremarkable at this timeObjective impairments include decreased activity tolerance, decreased balance, decreased endurance, decreased knowledge of condition, and decreased mobility. These impairments are limiting patient from community activity and fitness routine . Personal factors including Age and Fitness are also affecting patient's functional outcome. Patient will benefit from skilled PT to address above impairments and improve overall function.  REHAB POTENTIAL: Excellent  CLINICAL DECISION MAKING: Stable/uncomplicated  EVALUATION COMPLEXITY: Low   GOALS: Goals reviewed with patient? Yes  SHORT TERM GOALS:  STG Name Target Date Goal status  1 STGs=LTGs N/a REVISED  LONG TERM GOALS:   LTG Name Target Date Goal status  1 Patient to demonstrate independence in HEP  Baseline:Access  Code: [email or web address] 02/12/2021 INITIAL  2 Assess FGA to determine any potential fall risk. Baseline:TBA 02/12/2021 INITIAL  3 Patient to demo good understanding of osteoporosis management through activity level  Baseline:TBA 02/12/2021 INITIAL        PLAN: PT FREQUENCY: 1x/week  PT DURATION: 3 weeks  PLANNED INTERVENTIONS: Therapeutic exercises, Therapeutic activity, Neuro Muscular re-education, Balance training, Gait training, Patient/Family education, and Joint mobilization  PLAN FOR NEXT SESSION: Establish HEP for osteoporosis management   Hildred Laser PT 01/15/2021, 3:46 PM   Referring diagnosis? Other specified disorders of bone density and structure, right thigh  Treatment diagnosis? (if different than referring diagnosis) decreased functional mobility What was this (referring  dx) caused by? []  Surgery []  Fall [x]  Ongoing issue []  Arthritis []  Other: ____________  Laterality: []  Rt [x]  Lt []  Both  Check all possible CPT codes:  *CHOOSE 10 OR LESS*    [x]  97110 (Therapeutic Exercise)  []  92507 (SLP Treatment)  [x]  97112 (Neuro Re-ed)   []  92526 (Swallowing Treatment)   [x]  97116 (Gait Training)   []  (Cognitive Training, 1st 15 minutes) []  97140 (Manual Therapy)   []  97130 (Cognitive Training, each add'l 15 minutes)  [x]  97530 (Therapeutic Activities)  []  Other, List CPT Code ____________    [x]  97535 (Self Care)       []  All codes above (97110 - 97535)  []  97012 (Mechanical Traction)  []  97014 (E-stim Unattended)  []  97032 (E-stim manual)  []  97033 (Ionto)  []  (Ultrasound)  []  97760 (Orthotic Fit) []  (Physical Performance Training) []  (Aquatic Therapy) []  97034 (Contrast Bath) []  (Paraffin) []  97597 (Wound Care 1st 20 sq cm) []  97598 (Wound Care each add'l 20 sq cm) []  97016 (Vasopneumatic Device) []   ) []  (Prosthetic Training)

## 2021-01-22 ENCOUNTER — Other Ambulatory Visit: Payer: Self-pay

## 2021-01-22 ENCOUNTER — Ambulatory Visit: Payer: Medicare HMO

## 2021-01-22 DIAGNOSIS — R2681 Unsteadiness on feet: Secondary | ICD-10-CM

## 2021-01-22 DIAGNOSIS — M6281 Muscle weakness (generalized): Secondary | ICD-10-CM

## 2021-01-22 NOTE — Therapy (Addendum)
OUTPATIENT PHYSICAL THERAPY TREATMENT NOTE/DC Summary   Patient Name: Alicia [name] MRN: [id number] DOB:1945/05/13, 76 y.o., female Today's Date: 01/22/2021  PCP: Cari Caraway, MD REFERRING PROVIDER: Cari Caraway, MD  PHYSICAL THERAPY DISCHARGE SUMMARY  Visits from Start of Care: 2  Current functional level related to goals / functional outcomes: I in HEP   Remaining deficits: none   Education / Equipment: HEP and information on community based osteoporosis programs   Patient agrees to discharge. Patient goals were met. Patient is being discharged due to being pleased with the current functional level.    PT End of Session - 01/22/21 1433     Visit Number 2    Number of Visits 2    Date for PT Re-Evaluation 02/26/21    Authorization Type [employer] MCR    Progress Note Due on Visit 3    PT Start Time 1400    PT Stop Time 1430    PT Time Calculation (min) 30 min    Activity Tolerance Patient tolerated treatment well    Behavior During Therapy WFL for tasks assessed/performed             Past Medical History:  Diagnosis Date   Bell's palsy    Cataracts, bilateral    immature   GERD (gastroesophageal reflux disease)    takes OMeprazole daily   History of colon polyps    benign   Hyperlipidemia    was on meds but has been off for over 6 months   Hypertension    takes LIsinopril-HCTZ and Cartia daily   Insomnia    OTC sleep aide   Lyme disease    hx of   MVP (mitral valve prolapse)    per pt very minor.Echo done > 8 yrs ago   Nocturia    PONV (postoperative nausea and vomiting)    Post-nasal drip    takes Flonase daily   Past Surgical History:  Procedure Laterality Date   CERVICAL CONE BIOPSY     COLONOSCOPY     DILATION AND CURETTAGE OF UTERUS     GAS/FLUID EXCHANGE Left 12/25/2014   Procedure: GAS/FLUID EXCHANGE LEFT EYE;  Surgeon: Hayden Pedro, MD;  Location: [place];  Service: Ophthalmology;  Laterality: Left;   PARS PLANA VITRECTOMY Left  12/25/2014   laser, membrane peel, gas injection   PARS PLANA VITRECTOMY Left 12/25/2014   Procedure: PARS PLANA VITRECTOMY WITH 25 GAUGE LEFT EYE;  HEADSCOPE LASER;  Surgeon: Hayden Pedro, MD;  Location: [place];  Service: Ophthalmology;  Laterality: Left;   SHOULDER ARTHROSCOPY Left    "to remove scar tissue"   thumb surgery Right 1963   "straighted out deformed thumb"   TONSILLECTOMY     Patient Active Problem List   Diagnosis Date Noted   Vitreous hemorrhage, left eye ([place]) 12/25/2014    REFERRING DIAG: osteoporosis  THERAPY DIAG:  Unsteadiness on feet  Muscle weakness (generalized)  PERTINENT HISTORY: see eval  PRECAUTIONS: none  SUBJECTIVE: No new c/o has been compliant with HEP  PAIN:  Are you having pain? No NPRS scale: 0/10    OBJECTIVE:    DIAGNOSTIC FINDINGS: Decreased bone density    PATIENT SURVEYS:  FOTO 97(61 adjusted)   COGNITION:          Overall cognitive status: Within functional limits for tasks assessed  MUSCLE LENGTH: Hamstrings: negative Thomas test: negative   POSTURE:  WFL   PALPATION: Unremarkable    LE AROM/PROM:   A/PROM Right 01/15/2021 Left 01/15/2021  Hip flexion WNL WNL  Hip extension      Hip abduction      Hip adduction      Hip internal rotation      Hip external rotation      Knee flexion      Knee extension      Ankle dorsiflexion      Ankle plantarflexion      Ankle inversion      Ankle eversion       (WFL throughout)   LE MMT:   MMT Right 01/15/2021 Left 01/15/2021  Hip flexion      Hip extension      Hip abduction      Hip adduction      Hip internal rotation      Hip external rotation      Knee flexion      Knee extension      Ankle dorsiflexion      Ankle plantarflexion      Ankle inversion      Ankle eversion      (WFL throughout)   LOWER EXTREMITY SPECIAL TESTS:  Hip special tests: Saralyn Pilar (FABER) test: negative and Thomas test: negative   FUNCTIONAL TESTS:   5 STS 7s   GAIT: Distance walked: 200 Assistive device utilized: None Level of assistance: Complete Independence         TODAY'S TREATMENT: Eval and HEP  [facility] Adult PT Treatment:                                                DATE: 01/22/21 Therapeutic Exercise: seated hamstring stretch 30sx3 5x STS against green band adding OH reach  Self Care: Discussed limitations of osteoporosis and exercises rationale including appropriate exercises to provide needed stress to promote bone growth, cautioned against high impact and jumping tasks, provided guidance on possible future exercises and tasks she may benefit from, discussed community based osteoporosis clinic for additional information and activity options    PATIENT EDUCATION:  Education details: Discussed eval findings, rehab rationale and POC and patient is in agreement  Person educated: Patient Education method: Explanation Education comprehension: verbalized understanding     HOME EXERCISE PROGRAM: Access Code: [email or web address] URL: [email or web address] Date: 01/15/2021 Prepared by: Sharlynn Oliphant   Exercises Supine Bridge with Resistance Band - 2 x daily - 7 x weekly - 1 sets - 30 reps Seated Hamstring Stretch - 2 x daily - 7 x weekly - 1 sets - 3 reps - 30s hold Supine Bridge with Mini Swiss Ball Between Knees - 2 x daily - 7 x weekly - 1 sets - 30 reps Sit to Stand with Arms Crossed - 2 x daily - 7 x weekly - 1 sets - 10 reps     ASSESSMENT:   CLINICAL IMPRESSION: Rehab goals met, patient ready for independent management   REHAB POTENTIAL: Excellent   CLINICAL DECISION MAKING: Stable/uncomplicated   EVALUATION COMPLEXITY: Low     GOALS: Goals reviewed with patient? Yes   SHORT TERM GOALS:   STG Name Target Date Goal status  1 STGs=LTGs N/a REVISED  LONG TERM GOALS:    LTG Name Target Date Goal status  1 Patient  to demonstrate independence in HEP  Baseline:Access Code: [email or web address] 02/12/2021 Met   2 Assess FGA to determine any potential fall risk. Baseline: deferred 02/12/2021 deferred  3 Patient to demo good understanding of osteoporosis management through activity level  Baseline:TBA 02/12/2021 Met             PLAN: PT FREQUENCY: 1x/week   PT DURATION: 3 weeks   PLANNED INTERVENTIONS: Therapeutic exercises, Therapeutic activity, Neuro Muscular re-education, Balance training, Gait training, Patient/Family education, and Joint mobilization   PLAN FOR NEXT SESSION: DC to HEP        Jacqulynn Cadet Indiyah Paone PT 01/22/2021, 2:35 PM

## 2021-01-28 ENCOUNTER — Ambulatory Visit: Payer: Medicare HMO

## 2021-03-03 ENCOUNTER — Encounter (INDEPENDENT_AMBULATORY_CARE_PROVIDER_SITE_OTHER): Payer: Medicare HMO | Admitting: Ophthalmology

## 2021-03-03 ENCOUNTER — Other Ambulatory Visit: Payer: Self-pay

## 2021-03-03 DIAGNOSIS — H43811 Vitreous degeneration, right eye: Secondary | ICD-10-CM

## 2021-03-03 DIAGNOSIS — H35033 Hypertensive retinopathy, bilateral: Secondary | ICD-10-CM

## 2021-03-03 DIAGNOSIS — H2511 Age-related nuclear cataract, right eye: Secondary | ICD-10-CM

## 2021-03-03 DIAGNOSIS — I10 Essential (primary) hypertension: Secondary | ICD-10-CM

## 2021-03-14 ENCOUNTER — Other Ambulatory Visit: Payer: Self-pay

## 2021-03-14 ENCOUNTER — Inpatient Hospital Stay: Payer: Medicare HMO

## 2021-03-14 DIAGNOSIS — R002 Palpitations: Secondary | ICD-10-CM | POA: Diagnosis not present

## 2021-04-01 DIAGNOSIS — H35033 Hypertensive retinopathy, bilateral: Secondary | ICD-10-CM | POA: Diagnosis not present

## 2021-04-01 DIAGNOSIS — H524 Presbyopia: Secondary | ICD-10-CM | POA: Diagnosis not present

## 2021-04-03 DIAGNOSIS — R002 Palpitations: Secondary | ICD-10-CM | POA: Diagnosis not present

## 2021-04-11 ENCOUNTER — Telehealth: Payer: Self-pay | Admitting: Cardiology

## 2021-04-11 NOTE — Telephone Encounter (Signed)
Pt requesting monitor results.

## 2021-04-11 NOTE — Telephone Encounter (Signed)
Spoke to the patient and took care of it.  ? ?Thanks,  ?Dr. Odis Hollingshead

## 2021-04-11 NOTE — Telephone Encounter (Signed)
Patient requesting to speak with someone about monitor results. She says they've been made available last Thursday, so she would like for someone to call her back. ?

## 2021-04-16 ENCOUNTER — Telehealth: Payer: Self-pay

## 2021-04-21 ENCOUNTER — Other Ambulatory Visit: Payer: Self-pay | Admitting: Cardiology

## 2021-04-21 DIAGNOSIS — R002 Palpitations: Secondary | ICD-10-CM

## 2021-04-21 MED ORDER — DILTIAZEM HCL ER COATED BEADS 240 MG PO CP24: 240.0000 mg | ORAL_CAPSULE | Freq: Every day | ORAL | 0 refills | Status: DC

## 2021-04-21 NOTE — Telephone Encounter (Signed)
Sorry for the late response.  ?I resent it to the pharmacy.  ? ?Best Regards,  ? ?Dr. Odis Hollingshead

## 2021-04-25 NOTE — Telephone Encounter (Signed)
Called and spoke to patient yesterday 04/24/21 she is aware of medication refill she voiced understanding

## 2021-06-08 DIAGNOSIS — R002 Palpitations: Secondary | ICD-10-CM | POA: Diagnosis not present

## 2021-06-27 DIAGNOSIS — E782 Mixed hyperlipidemia: Secondary | ICD-10-CM | POA: Diagnosis not present

## 2021-06-27 DIAGNOSIS — R7309 Other abnormal glucose: Secondary | ICD-10-CM | POA: Diagnosis not present

## 2021-06-27 DIAGNOSIS — Z131 Encounter for screening for diabetes mellitus: Secondary | ICD-10-CM | POA: Diagnosis not present

## 2021-07-03 DIAGNOSIS — N952 Postmenopausal atrophic vaginitis: Secondary | ICD-10-CM | POA: Diagnosis not present

## 2021-07-03 DIAGNOSIS — K219 Gastro-esophageal reflux disease without esophagitis: Secondary | ICD-10-CM | POA: Diagnosis not present

## 2021-07-03 DIAGNOSIS — M810 Age-related osteoporosis without current pathological fracture: Secondary | ICD-10-CM | POA: Diagnosis not present

## 2021-07-03 DIAGNOSIS — R002 Palpitations: Secondary | ICD-10-CM | POA: Diagnosis not present

## 2021-07-03 DIAGNOSIS — E782 Mixed hyperlipidemia: Secondary | ICD-10-CM | POA: Diagnosis not present

## 2021-07-03 DIAGNOSIS — I10 Essential (primary) hypertension: Secondary | ICD-10-CM | POA: Diagnosis not present

## 2021-07-10 DIAGNOSIS — M720 Palmar fascial fibromatosis [Dupuytren]: Secondary | ICD-10-CM | POA: Diagnosis not present

## 2021-08-04 ENCOUNTER — Other Ambulatory Visit: Payer: Self-pay | Admitting: Family Medicine

## 2021-08-04 DIAGNOSIS — Z1231 Encounter for screening mammogram for malignant neoplasm of breast: Secondary | ICD-10-CM

## 2021-09-04 DIAGNOSIS — M79642 Pain in left hand: Secondary | ICD-10-CM | POA: Diagnosis not present

## 2021-09-04 DIAGNOSIS — M720 Palmar fascial fibromatosis [Dupuytren]: Secondary | ICD-10-CM | POA: Diagnosis not present

## 2021-09-04 DIAGNOSIS — Q740 Other congenital malformations of upper limb(s), including shoulder girdle: Secondary | ICD-10-CM | POA: Diagnosis not present

## 2021-09-04 DIAGNOSIS — M79641 Pain in right hand: Secondary | ICD-10-CM | POA: Diagnosis not present

## 2021-09-04 DIAGNOSIS — M13841 Other specified arthritis, right hand: Secondary | ICD-10-CM | POA: Diagnosis not present

## 2021-09-16 ENCOUNTER — Ambulatory Visit
Admission: RE | Admit: 2021-09-16 | Discharge: 2021-09-16 | Disposition: A | Discharge status: Home / Self Care | Payer: Medicare HMO | Source: Ambulatory Visit | Attending: Family Medicine | Admitting: Family Medicine

## 2021-09-16 DIAGNOSIS — Z1231 Encounter for screening mammogram for malignant neoplasm of breast: Secondary | ICD-10-CM

## 2021-10-29 DIAGNOSIS — Z23 Encounter for immunization: Secondary | ICD-10-CM | POA: Diagnosis not present

## 2021-10-29 DIAGNOSIS — N952 Postmenopausal atrophic vaginitis: Secondary | ICD-10-CM | POA: Diagnosis not present

## 2021-10-29 DIAGNOSIS — R300 Dysuria: Secondary | ICD-10-CM | POA: Diagnosis not present

## 2021-12-02 DIAGNOSIS — Z0100 Encounter for examination of eyes and vision without abnormal findings: Secondary | ICD-10-CM | POA: Diagnosis not present

## 2021-12-23 DIAGNOSIS — E782 Mixed hyperlipidemia: Secondary | ICD-10-CM | POA: Diagnosis not present

## 2021-12-23 DIAGNOSIS — I10 Essential (primary) hypertension: Secondary | ICD-10-CM | POA: Diagnosis not present

## 2021-12-29 DIAGNOSIS — Z6822 Body mass index (BMI) 22.0-22.9, adult: Secondary | ICD-10-CM | POA: Diagnosis not present

## 2021-12-29 DIAGNOSIS — Z1389 Encounter for screening for other disorder: Secondary | ICD-10-CM | POA: Diagnosis not present

## 2021-12-29 DIAGNOSIS — Z0000 Encounter for general adult medical examination without abnormal findings: Secondary | ICD-10-CM | POA: Diagnosis not present

## 2021-12-30 DIAGNOSIS — Z23 Encounter for immunization: Secondary | ICD-10-CM | POA: Diagnosis not present

## 2021-12-30 DIAGNOSIS — N952 Postmenopausal atrophic vaginitis: Secondary | ICD-10-CM | POA: Diagnosis not present

## 2021-12-30 DIAGNOSIS — R002 Palpitations: Secondary | ICD-10-CM | POA: Diagnosis not present

## 2021-12-30 DIAGNOSIS — Z6822 Body mass index (BMI) 22.0-22.9, adult: Secondary | ICD-10-CM | POA: Diagnosis not present

## 2021-12-30 DIAGNOSIS — Z79899 Other long term (current) drug therapy: Secondary | ICD-10-CM | POA: Diagnosis not present

## 2021-12-30 DIAGNOSIS — M810 Age-related osteoporosis without current pathological fracture: Secondary | ICD-10-CM | POA: Diagnosis not present

## 2021-12-30 DIAGNOSIS — I10 Essential (primary) hypertension: Secondary | ICD-10-CM | POA: Diagnosis not present

## 2021-12-30 DIAGNOSIS — E782 Mixed hyperlipidemia: Secondary | ICD-10-CM | POA: Diagnosis not present

## 2021-12-30 DIAGNOSIS — K219 Gastro-esophageal reflux disease without esophagitis: Secondary | ICD-10-CM | POA: Diagnosis not present

## 2022-01-01 ENCOUNTER — Ambulatory Visit: Payer: Medicare HMO | Admitting: Cardiology

## 2022-01-07 ENCOUNTER — Ambulatory Visit: Payer: Medicare HMO | Admitting: Cardiology

## 2022-01-07 ENCOUNTER — Encounter: Payer: Self-pay | Admitting: Cardiology

## 2022-01-07 VITALS — BP 118/73 | HR 73 | Resp 14 | Ht 63.0 in | Wt 133.4 lb

## 2022-01-07 DIAGNOSIS — I341 Nonrheumatic mitral (valve) prolapse: Secondary | ICD-10-CM | POA: Diagnosis not present

## 2022-01-07 NOTE — Progress Notes (Signed)
Date:  01/07/2022   ID:  Alicia [name], DOB 12/10/1945, MRN [id number]  PCP:  Gweneth Dimitri, MD  Cardiologist:  Tessa Lerner, DO, [facility] (established care 12/31/2020)  Date: 01/07/22 Last Office Visit: 12/31/2020  Chief Complaint  Patient presents with   Palpitations   Follow-up    1 year    HPI  Alicia [name] is a 76 y.o. Caucasian female whose past medical history and cardiovascular risk factors include: Benign essential hypertension, mixed hyperlipidemia, osteoporosis, GERD, chronic constipation, Lyme's disease in 2004, documented hx of mitral valve prolapse, former smoker, postmenopausal female, advanced age.   Patient was referred to the practice for evaluation and management of palpitations with a prior history of mitral valve prolapse.  Patient states that she was diagnosed with mitral valve prolapse at least 20 years ago by echocardiography; however, clinically no significant murmur has been auscultated on physical examination.  She presents for 1 year follow-up visit.  She denies anginal discomfort or heart failure symptoms.  Given her history of palpitations in the past she did undergo a Zio patch which illustrated an average heart rate of 71 bpm, no atrial fibrillation, no significant ectopic burden.  She had 1 episode of NSVT during which she was asymptomatic.  However, given her palpitations diltiazem dose was increased to 240 mg p.o. daily.  Clinically, her palpitations have essentially resolved on current dose of diltiazem.  FUNCTIONAL STATUS: Enjoys water aerobic all summer long and walks regularly. Works part-time at age of 89.    ALLERGIES: Allergies  Allergen Reactions   Augmentin [Amoxicillin-Pot Clavulanate] Nausea And Vomiting   Codeine Nausea And Vomiting    MEDICATION LIST PRIOR TO VISIT: Current Meds  Medication Sig   aspirin EC 81 MG tablet Take 81 mg by mouth 3 (three) times a week. Monday, Wednesday, Friday   Calcium Carb-Cholecalciferol 600-200 MG-UNIT  TABS Take 1 tablet by mouth daily.   Cholecalciferol (VITAMIN D3) 50 MCG (2000 UT) capsule Take 2,000 Units by mouth daily.   Collagen-Vitamin C-Biotin (COLLAGEN 1500/C PO) Take by mouth.   diltiazem (CARDIZEM CD) 240 MG 24 hr capsule Take 1 capsule (240 mg total) by mouth daily.   doxylamine, Sleep, (UNISOM) 25 MG tablet Take 25 mg by mouth at bedtime as needed for sleep.   ibuprofen (ADVIL,MOTRIN) 200 MG tablet Take 400 mg by mouth every 6 (six) hours as needed (pain).   lisinopril-hydrochlorothiazide (PRINZIDE,ZESTORETIC) 20-12.5 MG tablet Take 1 tablet by mouth daily.   omeprazole (PRILOSEC) 20 MG capsule Take 20 mg by mouth daily.   rosuvastatin (CRESTOR) 10 MG tablet    Vitamin A 2400 MCG (8000 UT) CAPS Take 2,400 mcg by mouth daily.     PAST MEDICAL HISTORY: Past Medical History:  Diagnosis Date   Bell's palsy    Cataracts, bilateral    immature   GERD (gastroesophageal reflux disease)    takes OMeprazole daily   History of colon polyps    benign   Hyperlipidemia    was on meds but has been off for over 6 months   Hypertension    takes LIsinopril-HCTZ and Cartia daily   Insomnia    OTC sleep aide   Lyme disease    hx of   MVP (mitral valve prolapse)    per pt very minor.Echo done > 8 yrs ago   Nocturia    PONV (postoperative nausea and vomiting)    Post-nasal drip    takes Flonase daily    PAST SURGICAL HISTORY: Past Surgical  History:  Procedure Laterality Date   CERVICAL CONE BIOPSY     COLONOSCOPY     DILATION AND CURETTAGE OF UTERUS     GAS/FLUID EXCHANGE Left 12/25/2014   Procedure: GAS/FLUID EXCHANGE LEFT EYE;  Surgeon: Sherrie George, MD;  Location: [facility] OR;  Service: Ophthalmology;  Laterality: Left;   PARS PLANA VITRECTOMY Left 12/25/2014   laser, membrane peel, gas injection   PARS PLANA VITRECTOMY Left 12/25/2014   Procedure: PARS PLANA VITRECTOMY WITH 25 GAUGE LEFT EYE;  HEADSCOPE LASER;  Surgeon: Sherrie George, MD;  Location: [facility] OR;  Service:  Ophthalmology;  Laterality: Left;   SHOULDER ARTHROSCOPY Left    "to remove scar tissue"   thumb surgery Right 1963   "straighted out deformed thumb"   TONSILLECTOMY      FAMILY HISTORY: The patient family history is not on file.  SOCIAL HISTORY:  The patient  reports that she has quit smoking. Her smoking use included cigarettes. She has a 1.65 pack-year smoking history. She has never used smokeless tobacco. She reports current alcohol use of about 1.0 - 3.0 standard drink of alcohol per week. She reports that she does not use drugs.  REVIEW OF SYSTEMS: Review of Systems  Cardiovascular:  Negative for chest pain, claudication, dyspnea on exertion, irregular heartbeat, leg swelling, near-syncope, orthopnea, palpitations, paroxysmal nocturnal dyspnea and syncope.  Respiratory:  Negative for shortness of breath.   Hematologic/Lymphatic: Negative for bleeding problem.  Musculoskeletal:  Negative for muscle cramps and myalgias.  Neurological:  Negative for dizziness and light-headedness.    PHYSICAL EXAM:    01/07/2022    1:29 PM 12/31/2020    9:01 AM 07/03/2019    9:48 AM  Vitals with BMI  Height 5\' 3"  5\' 3"  5\' 3"   Weight 133 lbs 6 oz 128 lbs 3 oz 129 lbs  BMI 23.64 22.72 22.86  Systolic 118 128   Diastolic 73 72   Pulse 73 80    Physical Exam  Constitutional: No distress.  Age appropriate, hemodynamically stable.   Neck: No JVD present.  Cardiovascular: Normal rate, regular rhythm, S1 normal, S2 normal, intact distal pulses and normal pulses. Exam reveals no gallop, no S3 and no S4.  No murmur heard. Pulses:      Dorsalis pedis pulses are 2+ on the right side and 2+ on the left side.       Posterior tibial pulses are 2+ on the right side and 2+ on the left side.  Pulmonary/Chest: Effort normal and breath sounds normal. No stridor. She has no wheezes. She has no rales.  Abdominal: Soft. Bowel sounds are normal. She exhibits no distension. There is no abdominal tenderness.   Musculoskeletal:        General: No edema.     Cervical back: Neck supple.  Neurological: She is alert and oriented to person, place, and time. She has intact cranial nerves (2-12).  Skin: Skin is warm and moist.   CARDIAC DATABASE: EKG: 01/07/2022: Normal sinus rhythm, 64 bpm, normal axis, without underlying ischemia or injury pattern.   Echocardiogram: 01/08/2021:  Hyperdynamic LV systolic function with visual EF >70%. Left ventricle cavity is normal in size. Normal left ventricular wall thickness. Normal global wall motion. Normal diastolic filling pattern, normal LAP.  Native mitral valve, mild calcification of the mitral valve annulus, normal mitral valve leaflet mobility. No evidence of mitral valve stenosis or regurgitation.  Mild tricuspid regurgitation. Mild pulmonary hypertension. RVSP measures 37 mmHg.  No prior study for  comparison.   Stress Testing: No results found for this or any previous visit from the past 1095 days.  Heart Catheterization: None  Cardiac monitor (Zio Patch): March 14, 2021- March 28, 2021 Patch Wear Time:  13 days and 17 hours  Dominant rhythm sinus rhythm. Heart rate 45-182 bpm.  Avg HR 71 bpm. No atrial fibrillation, high grade AV block, pauses (3 seconds or longer). Total ventricular ectopic burden <1%. 1 episode, auto triggered event, asymptomatic, NSVT, 4 beats, 1.9 seconds, max heart rate 141 bpm. Rare auto triggered events of PSVT noted. Total supraventricular ectopic burden <1%. Patient triggered events: 1.  Underlying rhythm sinus.  LABORATORY DATA:    Latest Ref Rng & Units 12/25/2014   10:02 AM 06/09/2008   11:23 PM  CBC  WBC 4.0 - 10.5 K/uL 4.4    Hemoglobin 12.0 - 15.0 g/dL 28.7  86.7   Hematocrit 36.0 - 46.0 % 36.6  42.0   Platelets 150 - 400 K/uL 250         Latest Ref Rng & Units 12/25/2014   10:02 AM 06/09/2008   11:23 PM  CMP  Glucose 65 - 99 mg/dL 672  094   BUN 6 - 20 mg/dL 23  20   Creatinine 7.09 - 1.00 mg/dL  6.28  0.7   Sodium 366 - 145 mmol/L 137  137   Potassium 3.5 - 5.1 mmol/L 4.2  3.4   Chloride 101 - 111 mmol/L 102  105   CO2 22 - 32 mmol/L 26    Calcium 8.9 - 10.3 mg/dL 9.6      Lipid Panel  No results found for: "CHOL", "TRIG", "HDL", "CHOLHDL", "VLDL", "LDLCALC", "LDLDIRECT", "LABVLDL"  No components found for: "NTPROBNP" No results for input(s): "PROBNP" in the last 8760 hours. No results for input(s): "TSH" in the last 8760 hours.  BMP No results for input(s): "NA", "K", "CL", "CO2", "GLUCOSE", "BUN", "CREATININE", "CALCIUM", "GFRNONAA", "GFRAA" in the last 8760 hours.  HEMOGLOBIN A1C No results found for: "HGBA1C", "MPG"  External Labs: Collected: 10/07/2020: Provided by PCP via [facility]. BUN 17, creatinine 0.77. Sodium 138, potassium 4.2, chloride 105, bicarb 27  09/23/2020 TSH 2.47 Hemoglobin 13.1 g/dL, hematocrit 29.4%  IMPRESSION:    ICD-10-CM   1. Mitral valve prolapse  I34.1 EKG 12-Lead       RECOMMENDATIONS: Alicia [name] is a 76 y.o. Caucasian female whose past medical history and cardiac risk factors include: Benign essential hypertension, mixed hyperlipidemia, osteoporosis, GERD, chronic constipation, Lyme's disease in 2004, documented mitral valve prolapse, former smoker, postmenopausal female, advanced age.   Patient was referred to the practice for evaluation and management of palpitations and documented history of mitral valve prolapse diagnosed by echocardiography greater than 20 years ago.  In the past she underwent appropriate workup with an echocardiogram and a Zio patch results reviewed as part of today's office visit/medical decision making.  Echocardiogram performed in 2022 did not illustrate mitral valve prolapse I suspect this is likely secondary to better blood pressure management.  On auscultation she does not have an MVP murmur.  Due to continued palpitations her dose of diltiazem was increased to 240 mg p.o. daily.  She is tolerating  the dose well and her symptoms of palpitations have resolved.  EKG today is nonischemic.  Her overall functional capacity remains stable.  No additional cardiovascular testing warranted at this time.  No clear indication for aspirin 81 mg p.o. daily the patient prefers to be on it.  Would recommend  either annual follow-up visits given her age and risk factors or as needed.  FINAL MEDICATION LIST END OF ENCOUNTER: No orders of the defined types were placed in this encounter.   There are no discontinued medications.    Current Outpatient Medications:    aspirin EC 81 MG tablet, Take 81 mg by mouth 3 (three) times a week. Monday, Wednesday, Friday, Disp: , Rfl:    Calcium Carb-Cholecalciferol 600-200 MG-UNIT TABS, Take 1 tablet by mouth daily., Disp: , Rfl:    Cholecalciferol (VITAMIN D3) 50 MCG (2000 UT) capsule, Take 2,000 Units by mouth daily., Disp: , Rfl:    Collagen-Vitamin C-Biotin (COLLAGEN 1500/C PO), Take by mouth., Disp: , Rfl:    diltiazem (CARDIZEM CD) 240 MG 24 hr capsule, Take 1 capsule (240 mg total) by mouth daily., Disp: 90 capsule, Rfl: 0   doxylamine, Sleep, (UNISOM) 25 MG tablet, Take 25 mg by mouth at bedtime as needed for sleep., Disp: , Rfl:    ibuprofen (ADVIL,MOTRIN) 200 MG tablet, Take 400 mg by mouth every 6 (six) hours as needed (pain)., Disp: , Rfl:    lisinopril-hydrochlorothiazide (PRINZIDE,ZESTORETIC) 20-12.5 MG tablet, Take 1 tablet by mouth daily., Disp: , Rfl:    omeprazole (PRILOSEC) 20 MG capsule, Take 20 mg by mouth daily., Disp: , Rfl:    rosuvastatin (CRESTOR) 10 MG tablet, , Disp: , Rfl:    Vitamin A 2400 MCG (8000 UT) CAPS, Take 2,400 mcg by mouth daily., Disp: , Rfl:   Orders Placed This Encounter  Procedures   EKG 12-Lead    There are no Patient Instructions on file for this visit.   --Continue cardiac medications as reconciled in final medication list. --Return in about 1 year (around 01/08/2023) for Annual follow up visit. Or sooner  if needed. --Continue follow-up with your primary care physician regarding the management of your other chronic comorbid conditions.  Patient's questions and concerns were addressed to her satisfaction. She voices understanding of the instructions provided during this encounter.   This note was created using a voice recognition software as a result there may be grammatical errors inadvertently enclosed that do not reflect the nature of this encounter. Every attempt is made to correct such errors.  Tessa LernerSunit Akoni Parton, [place]DO, [facility] Eye Surgical CenterFACC  Pager: [phone] Office: [phone]

## 2022-01-08 ENCOUNTER — Ambulatory Visit: Payer: Medicare HMO | Admitting: Cardiology

## 2022-03-02 ENCOUNTER — Encounter (INDEPENDENT_AMBULATORY_CARE_PROVIDER_SITE_OTHER): Payer: Medicare HMO | Admitting: Ophthalmology

## 2022-03-02 DIAGNOSIS — H35342 Macular cyst, hole, or pseudohole, left eye: Secondary | ICD-10-CM

## 2022-03-02 DIAGNOSIS — I10 Essential (primary) hypertension: Secondary | ICD-10-CM | POA: Diagnosis not present

## 2022-03-02 DIAGNOSIS — H43811 Vitreous degeneration, right eye: Secondary | ICD-10-CM

## 2022-03-02 DIAGNOSIS — H35033 Hypertensive retinopathy, bilateral: Secondary | ICD-10-CM

## 2022-08-19 ENCOUNTER — Other Ambulatory Visit: Payer: Self-pay | Admitting: Family Medicine

## 2022-08-19 DIAGNOSIS — Z1231 Encounter for screening mammogram for malignant neoplasm of breast: Secondary | ICD-10-CM

## 2022-09-22 ENCOUNTER — Ambulatory Visit
Admission: RE | Admit: 2022-09-22 | Discharge: 2022-09-22 | Disposition: A | Discharge status: Home / Self Care | Payer: Medicare HMO | Source: Ambulatory Visit | Attending: Family Medicine | Admitting: Family Medicine

## 2022-09-22 DIAGNOSIS — Z1231 Encounter for screening mammogram for malignant neoplasm of breast: Secondary | ICD-10-CM | POA: Diagnosis not present

## 2022-10-06 DIAGNOSIS — H2511 Age-related nuclear cataract, right eye: Secondary | ICD-10-CM | POA: Diagnosis not present

## 2022-10-06 DIAGNOSIS — H25011 Cortical age-related cataract, right eye: Secondary | ICD-10-CM | POA: Diagnosis not present

## 2022-10-06 DIAGNOSIS — H25041 Posterior subcapsular polar age-related cataract, right eye: Secondary | ICD-10-CM | POA: Diagnosis not present

## 2022-10-06 DIAGNOSIS — Z961 Presence of intraocular lens: Secondary | ICD-10-CM | POA: Diagnosis not present

## 2022-11-13 DIAGNOSIS — H2511 Age-related nuclear cataract, right eye: Secondary | ICD-10-CM | POA: Diagnosis not present

## 2022-12-18 DIAGNOSIS — H2511 Age-related nuclear cataract, right eye: Secondary | ICD-10-CM | POA: Diagnosis not present

## 2022-12-31 ENCOUNTER — Other Ambulatory Visit: Payer: Self-pay | Admitting: Family Medicine

## 2022-12-31 DIAGNOSIS — K219 Gastro-esophageal reflux disease without esophagitis: Secondary | ICD-10-CM | POA: Diagnosis not present

## 2022-12-31 DIAGNOSIS — M810 Age-related osteoporosis without current pathological fracture: Secondary | ICD-10-CM

## 2022-12-31 DIAGNOSIS — Z23 Encounter for immunization: Secondary | ICD-10-CM | POA: Diagnosis not present

## 2022-12-31 DIAGNOSIS — Z0000 Encounter for general adult medical examination without abnormal findings: Secondary | ICD-10-CM | POA: Diagnosis not present

## 2022-12-31 DIAGNOSIS — M85851 Other specified disorders of bone density and structure, right thigh: Secondary | ICD-10-CM | POA: Diagnosis not present

## 2022-12-31 DIAGNOSIS — I10 Essential (primary) hypertension: Secondary | ICD-10-CM | POA: Diagnosis not present

## 2022-12-31 DIAGNOSIS — Z79899 Other long term (current) drug therapy: Secondary | ICD-10-CM | POA: Diagnosis not present

## 2022-12-31 DIAGNOSIS — E782 Mixed hyperlipidemia: Secondary | ICD-10-CM | POA: Diagnosis not present

## 2022-12-31 DIAGNOSIS — Z1211 Encounter for screening for malignant neoplasm of colon: Secondary | ICD-10-CM | POA: Diagnosis not present

## 2022-12-31 DIAGNOSIS — Z1331 Encounter for screening for depression: Secondary | ICD-10-CM | POA: Diagnosis not present

## 2022-12-31 DIAGNOSIS — Z6822 Body mass index (BMI) 22.0-22.9, adult: Secondary | ICD-10-CM | POA: Diagnosis not present

## 2023-01-11 ENCOUNTER — Ambulatory Visit: Payer: Medicare HMO | Admitting: Cardiology

## 2023-01-12 DIAGNOSIS — Z0100 Encounter for examination of eyes and vision without abnormal findings: Secondary | ICD-10-CM | POA: Diagnosis not present

## 2023-02-07 NOTE — Progress Notes (Unsigned)
Cardiology Office Note:  .   Date:  02/08/2023  ID:  Alicia [name], DOB August 09, 1945, MRN [id number] PCP:  Gweneth Dimitri, MD  Former Cardiology Providers: None [facility] Providers Cardiologist:  Tessa Lerner, DO , [facility] (established care 12/31/2020 ) Electrophysiologist:  None  Click to update primary MD,subspecialty MD or APP then REFRESH:1}    Chief Complaint  Patient presents with   Follow-up    1 year follow up palpitations    History of Present Illness: Marland Kitchen   Alicia [name] is a 78 y.o. Caucasian female whose past medical history and cardiovascular risk factors includes: Benign essential hypertension, mixed hyperlipidemia, osteoporosis, GERD, chronic constipation, Lyme's disease in 2004, documented hx of mitral valve prolapse, former smoker, postmenopausal female, advanced age.   Patient was referred to the practice for evaluation of palpitations and documented history of mitral valve prolapse diagnosed by echocardiography greater than 20 years ago.  Since establishing care she did undergo cardiac monitor and echocardiogram.  Prior echocardiogram from 2022 did not illustrate mitral valve prolapse and on auscultation she does not have an MVP murmur.  Due to continued palpitations she was started on diltiazem and the dose was uptitrated for symptom management  Patient was last seen in the office in December 2023 and now presents for follow-up.  Since last office visit patient denies any anginal chest pain or heart failure symptoms.  On the current dose of diltiazem patient states her symptoms of palpitations have been well-controlled.  She is very happy with the progress.  Review of Systems: .   Review of Systems  Cardiovascular:  Negative for chest pain, claudication, irregular heartbeat, leg swelling, near-syncope, orthopnea, palpitations, paroxysmal nocturnal dyspnea and syncope.  Respiratory:  Negative for shortness of breath.   Hematologic/Lymphatic: Negative for bleeding  problem.    Studies Reviewed:   EKG: EKG Interpretation Date/Time:  Monday February 08 2023 09:10:28 EST Ventricular Rate:  70 PR Interval:  136 QRS Duration:  82 QT Interval:  402 QTC Calculation: 434 R Axis:   52  Text Interpretation: Sinus rhythm with Premature atrial complexes When compared with ECG of 25-Dec-2014 09:32, Premature atrial complexes are now Present Confirmed by Tessa Lerner [phone]) on 02/08/2023 9:19:11 AM  Echocardiogram: 01/08/2021:  Hyperdynamic LV systolic function with visual EF >70%. Left ventricle cavity is normal in size. Normal left ventricular wall thickness. Normal global wall motion. Normal diastolic filling pattern, normal LAP.  Native mitral valve, mild calcification of the mitral valve annulus, normal mitral valve leaflet mobility. No evidence of mitral valve stenosis or regurgitation.  Mild tricuspid regurgitation. Mild pulmonary hypertension. RVSP measures 37 mmHg.  No prior study for comparison.   Cardiac monitor [facility] Patch): March 14, 2021- March 28, 2021 Patch Wear Time:  13 days and 17 hours  Dominant rhythm sinus rhythm. Heart rate 45-182 bpm.  Avg HR 71 bpm. No atrial fibrillation, high grade AV block, pauses (3 seconds or longer). Total ventricular ectopic burden <1%. 1 episode, auto triggered event, asymptomatic, NSVT, 4 beats, 1.9 seconds, max heart rate 141 bpm. Rare auto triggered events of PSVT noted. Total supraventricular ectopic burden <1%. Patient triggered events: 1.  Underlying rhythm sinus.  RADIOLOGY: NA  Risk Assessment/Calculations:   NA   Labs:    Component Value Reference Range Notes  Lipid Panel w/reflex Reviewed date:01/02/2023 03:07:25 PM Interpretation:just above goal range Performing Lab: Notes/Report: Testing Performed at: [facility], [address], [place], [place] [address]  Cholesterol 175 <200 mg/dL  CHOL/HDL 3.3 2.0-4.0 Ratio    HDLD 54 30-85 mg/dL Values below 40 mg/dL indicate  increased risk factor  Triglyceride 104 0-199 mg/dL    NHDL 161 0-960 mg/dL Range dependent upon risk factors.  LDL Chol Calc (NIH) 103 0-99 mg/dL    Vitamin A54 Reviewed date:01/02/2023 03:07:25 PM Interpretation: Normal Performing Lab: Notes/Report: Testing Performed at: [facility], [address], [address], [place], [place] [address]  B12 335 180-914 pg/mL    Microalbumin Panel Reviewed date:01/02/2023 03:07:25 PM Interpretation: Normal Performing Lab: Notes/Report: Testing Performed at: [facility], [address], [address], [place], [place] [address]  MA/CR ratio 10.5 0.0-30.0 mg/G    UMA 1.32 0.00-1.90 mg/dL    UCR 295      Comp Metabolic Panel Reviewed date:01/02/2023 03:07:25 PM Interpretation: Normal Performing Lab: Notes/Report: Testing Performed at: [facility], [address], [address], [place], [place] [address]  Glucose 95 70-99 mg/dL    BUN 23 8-65 mg/dL    Creatinine 7.84 6.96-2.95 mg/dl    [id number] 74 >01 calc In accordance with recommendations from NKF-ASN Task Force, [facility] has updated its eGFR calc to the 2021 CKD-EDI equation that estimates kidney function without a race variable;Stage 1 > 90 ML/Min plus Albuminuria;Stage 2 60-89 ML/MIN;Stage 3 30-59 ML/MIN;Stage 4 15-29 ML/MIN;Stage 5 <15 ML/MIN  Sodium 138 136-145 mmol/L    Potassium 4.9 3.5-5.5 mmol/L    Chloride 104 98-107 mmol/L    CO2 27 22-32 mmol/L    Anion Gap 11.6 6.0-20.0 mmol/L    Calcium 9.5 8.6-10.3 mg/dL    CA-corrected 0.27 2.53-66.44 mg/dL    Protein, Total 6.8 0.3-4.7 g/dL    Albumin 4.5 4.2-5.9 g/dL    TBIL 0.5 5.6-3.8 mg/dL    ALP 74 75-643 U/L    AST 21 0-39 U/L    ALT 18 0-52 U/L      Physical Exam:    Today's Vitals   02/08/23 0911  BP: 134/68  Pulse: 68  SpO2: 96%  Weight: 129 lb 3.2 oz (58.6 kg)  Height: 5\' 4"  (1.626 m)   Body mass index is 22.18 kg/m. Wt Readings from Last 3 Encounters:  02/08/23 129 lb 3.2 oz (58.6 kg)  01/07/22 133 lb 6.4 oz (60.5 kg)   12/31/20 128 lb 3.2 oz (58.2 kg)    Physical Exam  Constitutional: No distress.  hemodynamically stable  Neck: No JVD present.  Cardiovascular: Normal rate, regular rhythm, S1 normal and S2 normal. Exam reveals no gallop, no S3 and no S4.  No murmur heard. Pulses:      Dorsalis pedis pulses are 2+ on the right side and 2+ on the left side.       Posterior tibial pulses are 2+ on the right side and 2+ on the left side.  Pulmonary/Chest: Effort normal and breath sounds normal. No stridor. She has no wheezes. She has no rales.  Abdominal: Soft. Bowel sounds are normal. She exhibits no distension. There is no abdominal tenderness.  Musculoskeletal:        General: No edema.     Cervical back: Neck supple.  Neurological: She is alert and oriented to person, place, and time. She has intact cranial nerves (2-12).  Skin: Skin is warm.     Impression & Recommendation(s):  Impression:   ICD-10-CM   1. Palpitations  R00.2 EKG 12-Lead    diltiazem (CARDIZEM CD) 240 MG 24 hr capsule    DISCONTINUED: diltiazem (CARDIZEM CD) 240 MG 24 hr capsule  2. Benign hypertension  I10     3. Mixed hyperlipidemia  E78.2        Recommendation(s):  Palpitations Remains asymptomatic on current dose of diltiazem. EKG today showed illustrates sinus rhythm with rare PACs. Will refill diltiazem 240 mg p.o. daily.  Benign hypertension Office blood pressures are well-controlled. Continue home medications-medications are not accurately reconciled. Patient is not sure if she is on lisinopril 10 mg p.o. daily or lisinopril/HCTZ combination.  She will send [place] a message through MyChart when she goes home to update her MAR. Reemphasized importance of low-salt diet.  Mixed hyperlipidemia Currently on Crestor 10 mg p.o. daily.   She denies myalgia or other side effects. Most recent lipids dated January 02, 2023, independently reviewed as noted above.  LDL is 103 mg/day.  Cardiology is following  peripherally.  Orders Placed:  Orders Placed This Encounter  Procedures   EKG 12-Lead   As part of today's office visit reviewed management of at least 2 chronic comorbid conditions, refilled medications, EKG ordered and independently reviewed, outside labs from 01/02/2023 independently reviewed.  Final Medication List:    Meds ordered this encounter  Medications   DISCONTD: diltiazem (CARDIZEM CD) 240 MG 24 hr capsule    Sig: Take 1 capsule (240 mg total) by mouth daily.    Dispense:  90 capsule    Refill:  3   diltiazem (CARDIZEM CD) 240 MG 24 hr capsule    Sig: Take 1 capsule (240 mg total) by mouth daily.    Dispense:  90 capsule    Refill:  3    Medications Discontinued During This Encounter  Medication Reason   lisinopril-hydrochlorothiazide (PRINZIDE,ZESTORETIC) 20-12.5 MG tablet    diltiazem (CARDIZEM CD) 240 MG 24 hr capsule Reorder   diltiazem (CARDIZEM CD) 240 MG 24 hr capsule      Current Outpatient Medications:    aspirin EC 81 MG tablet, Take 81 mg by mouth 3 (three) times a week. Monday, Wednesday, Friday, Disp: , Rfl:    Calcium Carb-Cholecalciferol 600-200 MG-UNIT TABS, Take 1 tablet by mouth daily., Disp: , Rfl:    Cholecalciferol (VITAMIN D3) 50 MCG (2000 UT) capsule, Take 2,000 Units by mouth daily., Disp: , Rfl:    Collagen-Vitamin C-Biotin (COLLAGEN 1500/C PO), Take by mouth., Disp: , Rfl:    diltiazem (CARDIZEM CD) 240 MG 24 hr capsule, Take 1 capsule (240 mg total) by mouth daily., Disp: 90 capsule, Rfl: 3   doxylamine, Sleep, (UNISOM) 25 MG tablet, Take 25 mg by mouth at bedtime as needed for sleep., Disp: , Rfl:    ibuprofen (ADVIL,MOTRIN) 200 MG tablet, Take 400 mg by mouth every 6 (six) hours as needed (pain)., Disp: , Rfl:    lisinopril (ZESTRIL) 10 MG tablet, Take 10 mg by mouth daily., Disp: , Rfl:    omeprazole (PRILOSEC) 20 MG capsule, Take 20 mg by mouth daily., Disp: , Rfl:    rosuvastatin (CRESTOR) 10 MG tablet, , Disp: , Rfl:    Vitamin A  2400 MCG (8000 UT) CAPS, Take 2,400 mcg by mouth daily., Disp: , Rfl:   Consent:   NA  Disposition:   Patient has remained stable over the last 1 year on current medical therapy.  Shared decision was to follow-up on appearing basis sooner if change in clinical status.  Her questions and concerns were addressed to her satisfaction. She voices understanding of the recommendations provided during this encounter.    Signed, Delilah Shan, [facility]  [facility]  [address] [place], [place] [address] 02/08/2023 9:29 AM

## 2023-02-08 ENCOUNTER — Encounter: Payer: Self-pay | Admitting: Cardiology

## 2023-02-08 ENCOUNTER — Ambulatory Visit: Payer: Medicare HMO | Attending: Cardiology | Admitting: Cardiology

## 2023-02-08 VITALS — BP 134/68 | HR 68 | Ht 64.0 in | Wt 129.2 lb

## 2023-02-08 DIAGNOSIS — E782 Mixed hyperlipidemia: Secondary | ICD-10-CM

## 2023-02-08 DIAGNOSIS — R002 Palpitations: Secondary | ICD-10-CM

## 2023-02-08 DIAGNOSIS — I10 Essential (primary) hypertension: Secondary | ICD-10-CM

## 2023-02-08 MED ORDER — DILTIAZEM HCL ER COATED BEADS 240 MG PO CP24
240.0000 mg | ORAL_CAPSULE | Freq: Every day | ORAL | 3 refills | Status: DC
Start: 2023-02-08 — End: 2023-02-08

## 2023-02-08 MED ORDER — DILTIAZEM HCL ER COATED BEADS 240 MG PO CP24: 240.0000 mg | ORAL_CAPSULE | Freq: Every day | ORAL | 3 refills | Status: AC

## 2023-02-08 NOTE — Patient Instructions (Signed)
Medication Instructions:  Your physician recommends that you continue on your current medications as directed. Please refer to the Current Medication list given to you today.  Refill for Diltiazem (Cardizem) sent in to pharmacy.  *If you need a refill on your cardiac medications before your next appointment, please call your pharmacy*  Lab Work: None ordered today. If you have labs (blood work) drawn today and your tests are completely normal, you will receive your results only by: MyChart Message (if you have MyChart) OR A paper copy in the mail If you have any lab test that is abnormal or we need to change your treatment, we will call you to review the results.  Testing/Procedures: None ordered today.  Follow-Up: At [facility], you and your health needs are our priority.  As part of our continuing mission to provide you with exceptional heart care, we have created designated Provider Care Teams.  These Care Teams include your primary Cardiologist (physician) and Advanced Practice Providers (APPs -  Physician Assistants and Nurse Practitioners) who all work together to provide you with the care you need, when you need it.  We recommend signing up for the patient portal called "MyChart".  Sign up information is provided on this After Visit Summary.  MyChart is used to connect with patients for Virtual Visits (Telemedicine).  Patients are able to view lab/test results, encounter notes, upcoming appointments, etc.  Non-urgent messages can be sent to your provider as well.   To learn more about what you can do with MyChart, go to [email or web address].    Your next appointment:   As needed   The format for your next appointment:   In Person  Provider:   Tessa Lerner, DO {

## 2023-02-09 DIAGNOSIS — Z1211 Encounter for screening for malignant neoplasm of colon: Secondary | ICD-10-CM | POA: Diagnosis not present

## 2023-03-01 ENCOUNTER — Encounter (INDEPENDENT_AMBULATORY_CARE_PROVIDER_SITE_OTHER): Payer: Medicare HMO | Admitting: Ophthalmology

## 2023-03-01 DIAGNOSIS — H35033 Hypertensive retinopathy, bilateral: Secondary | ICD-10-CM | POA: Diagnosis not present

## 2023-03-01 DIAGNOSIS — H59031 Cystoid macular edema following cataract surgery, right eye: Secondary | ICD-10-CM | POA: Diagnosis not present

## 2023-03-01 DIAGNOSIS — H35342 Macular cyst, hole, or pseudohole, left eye: Secondary | ICD-10-CM

## 2023-03-01 DIAGNOSIS — I10 Essential (primary) hypertension: Secondary | ICD-10-CM | POA: Diagnosis not present

## 2023-03-01 DIAGNOSIS — H43811 Vitreous degeneration, right eye: Secondary | ICD-10-CM | POA: Diagnosis not present

## 2023-03-11 DIAGNOSIS — Z6822 Body mass index (BMI) 22.0-22.9, adult: Secondary | ICD-10-CM | POA: Diagnosis not present

## 2023-03-11 DIAGNOSIS — E782 Mixed hyperlipidemia: Secondary | ICD-10-CM | POA: Diagnosis not present

## 2023-03-11 DIAGNOSIS — M810 Age-related osteoporosis without current pathological fracture: Secondary | ICD-10-CM | POA: Diagnosis not present

## 2023-03-11 DIAGNOSIS — I10 Essential (primary) hypertension: Secondary | ICD-10-CM | POA: Diagnosis not present

## 2023-03-11 DIAGNOSIS — Z79899 Other long term (current) drug therapy: Secondary | ICD-10-CM | POA: Diagnosis not present

## 2023-03-11 DIAGNOSIS — K219 Gastro-esophageal reflux disease without esophagitis: Secondary | ICD-10-CM | POA: Diagnosis not present

## 2023-03-11 DIAGNOSIS — E559 Vitamin D deficiency, unspecified: Secondary | ICD-10-CM | POA: Diagnosis not present

## 2023-03-11 DIAGNOSIS — R002 Palpitations: Secondary | ICD-10-CM | POA: Diagnosis not present

## 2023-03-11 DIAGNOSIS — N952 Postmenopausal atrophic vaginitis: Secondary | ICD-10-CM | POA: Diagnosis not present

## 2023-04-12 ENCOUNTER — Encounter (INDEPENDENT_AMBULATORY_CARE_PROVIDER_SITE_OTHER): Payer: Self-pay

## 2023-04-12 ENCOUNTER — Encounter (INDEPENDENT_AMBULATORY_CARE_PROVIDER_SITE_OTHER): Payer: Medicare HMO | Admitting: Ophthalmology

## 2023-04-13 ENCOUNTER — Encounter (INDEPENDENT_AMBULATORY_CARE_PROVIDER_SITE_OTHER): Payer: Medicare HMO | Admitting: Ophthalmology

## 2023-04-22 ENCOUNTER — Encounter (INDEPENDENT_AMBULATORY_CARE_PROVIDER_SITE_OTHER): Admitting: Ophthalmology

## 2023-04-22 DIAGNOSIS — H35371 Puckering of macula, right eye: Secondary | ICD-10-CM

## 2023-04-22 DIAGNOSIS — H59031 Cystoid macular edema following cataract surgery, right eye: Secondary | ICD-10-CM

## 2023-04-22 DIAGNOSIS — H35033 Hypertensive retinopathy, bilateral: Secondary | ICD-10-CM

## 2023-04-22 DIAGNOSIS — H43811 Vitreous degeneration, right eye: Secondary | ICD-10-CM

## 2023-04-22 DIAGNOSIS — H3533 Angioid streaks of macula: Secondary | ICD-10-CM | POA: Diagnosis not present

## 2023-04-22 DIAGNOSIS — I10 Essential (primary) hypertension: Secondary | ICD-10-CM | POA: Diagnosis not present

## 2023-05-05 IMAGING — MG MM DIGITAL SCREENING BILAT W/ TOMO AND CAD
8 series · 9 of 24 positions shown · non-contrast
Comparison: Previous exam(s).

CLINICAL DATA: Screening.

EXAM:
DIGITAL SCREENING BILATERAL MAMMOGRAM WITH TOMOSYNTHESIS AND CAD
TECHNIQUE: Bilateral screening digital craniocaudal and mediolateral oblique
mammograms were obtained. Bilateral screening digital breast
tomosynthesis was performed. The images were evaluated with
computer-aided detection.

[L MLO synth-2D]
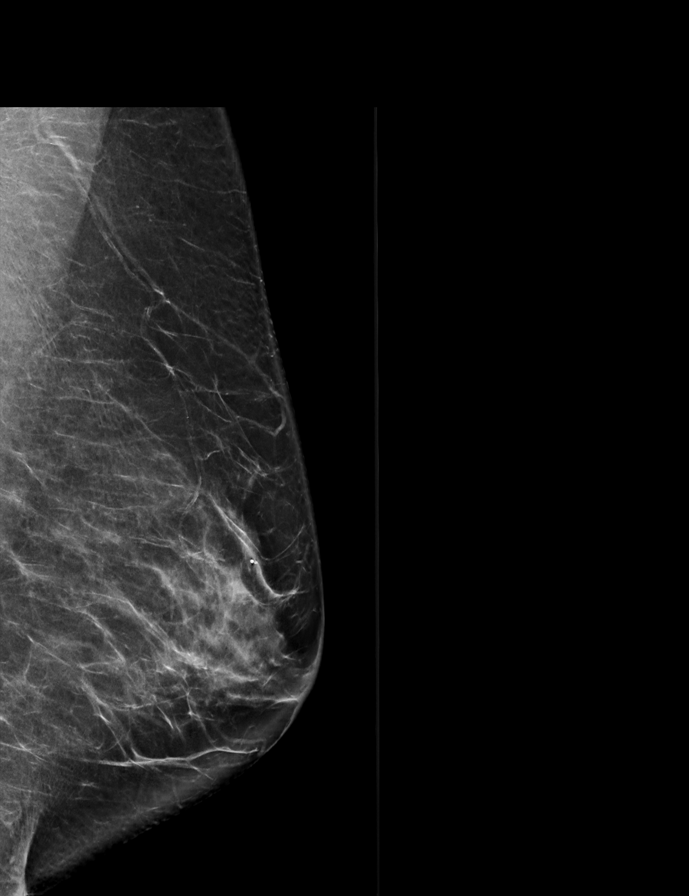

[L CC synth-2D]
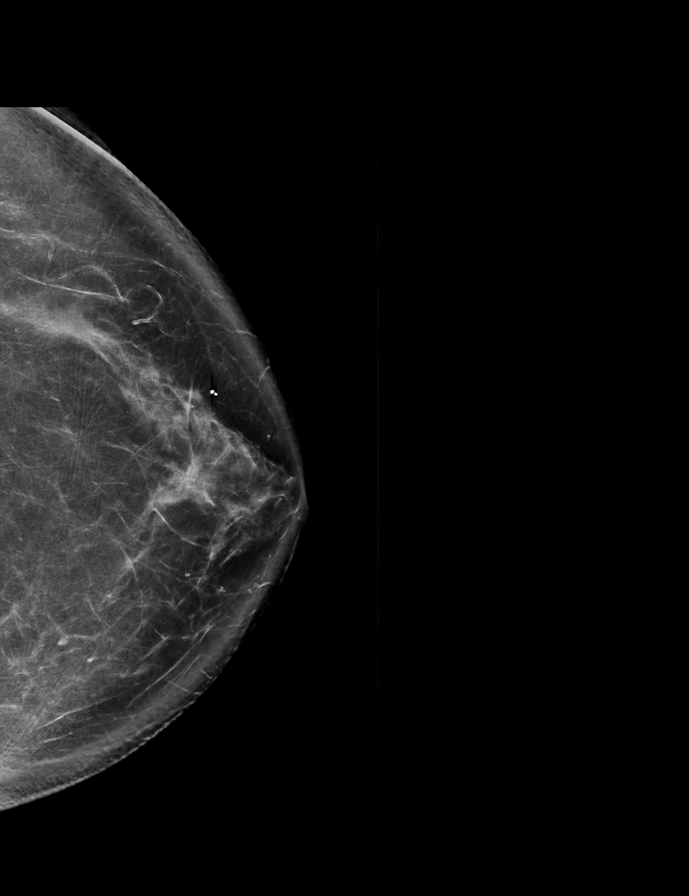

[R MLO synth-2D]
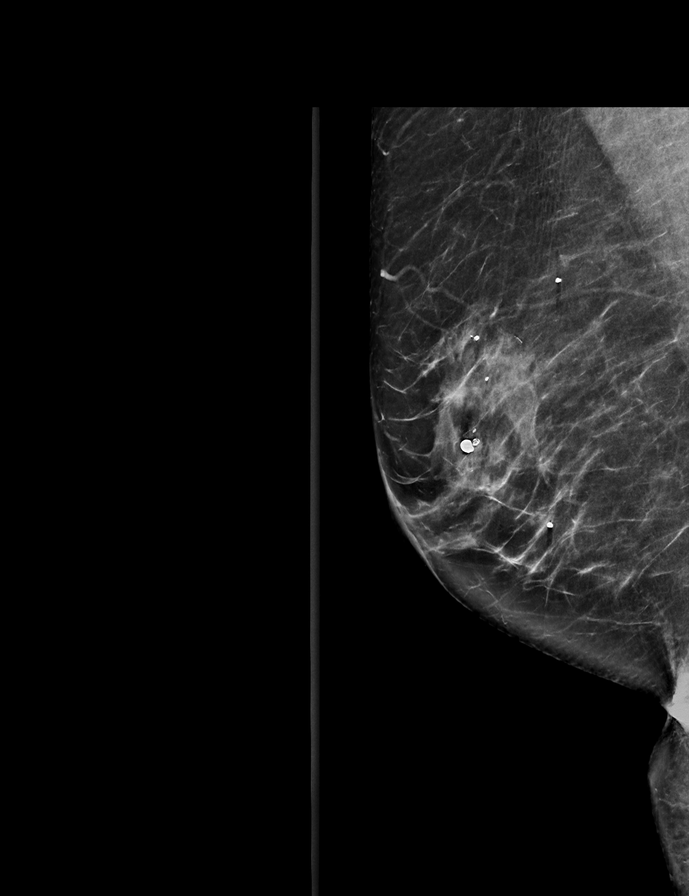

[R CC synth-2D]
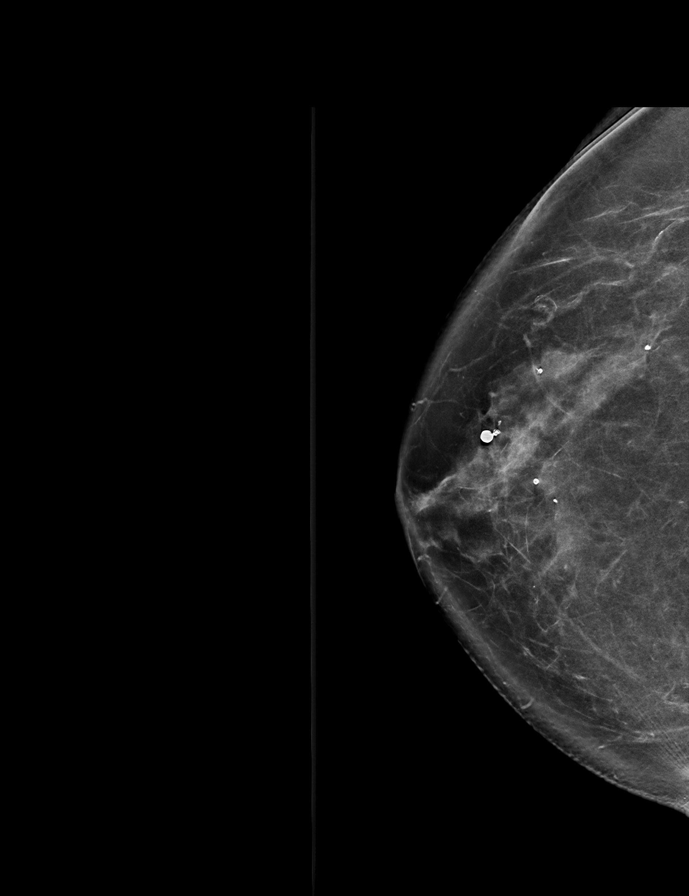

[R MLO tomo · 2 of 71 frames shown]
[frame 23/71]
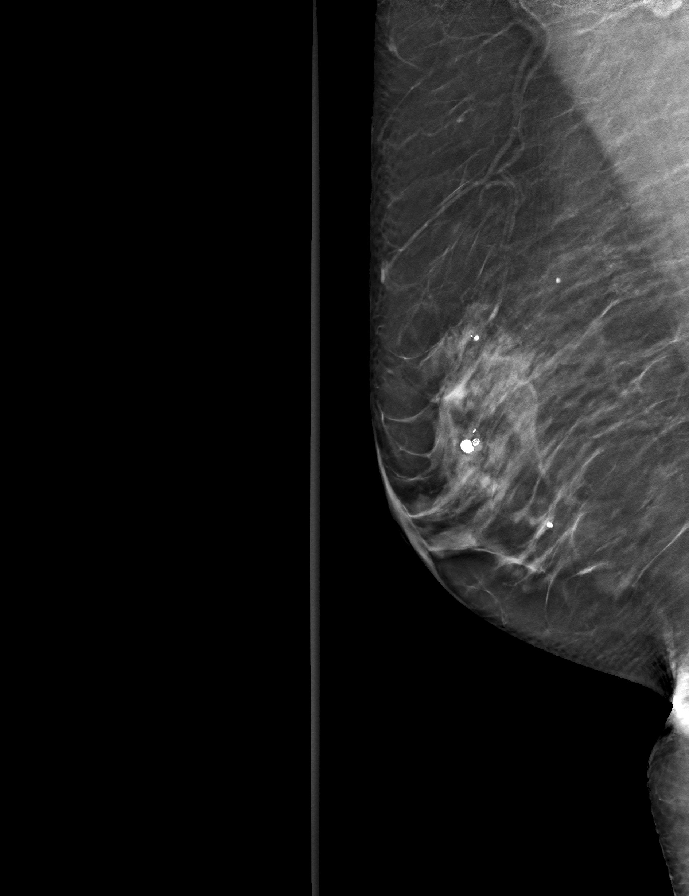
[frame 36/71]
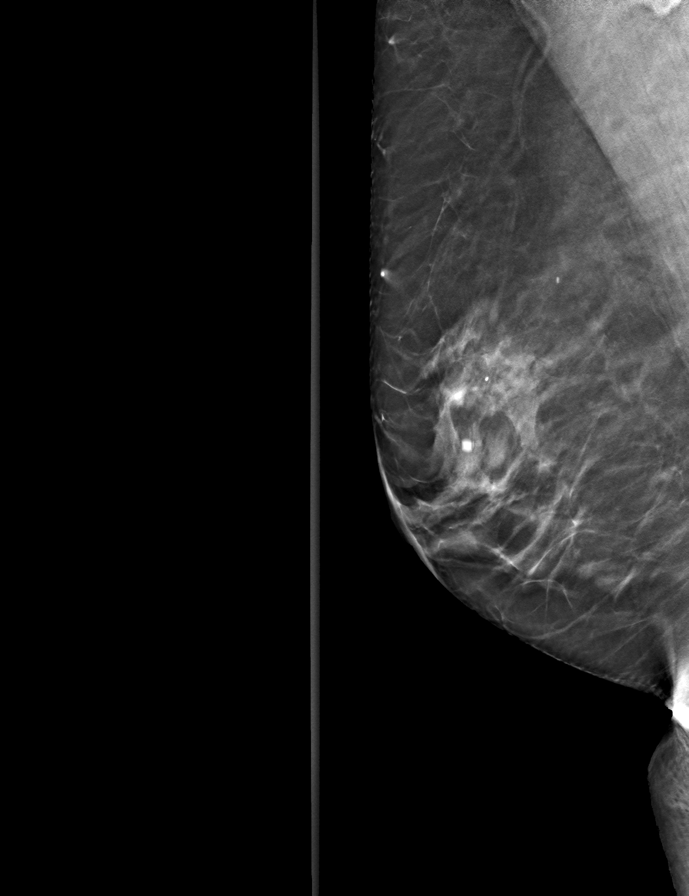

[L MLO tomo · tomo slice 39/76.0]
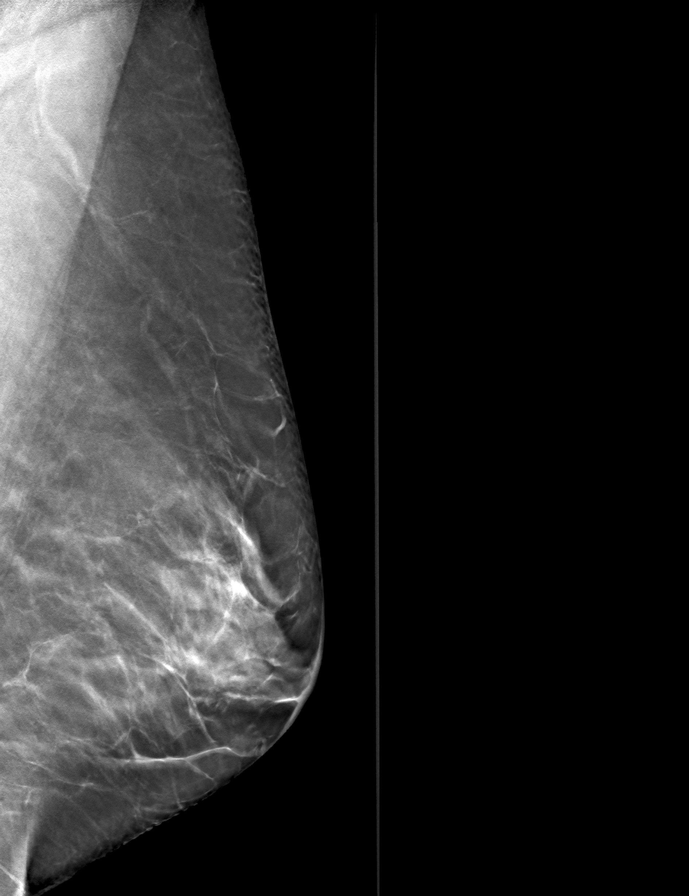

[L CC tomo · tomo slice 41/81.0]
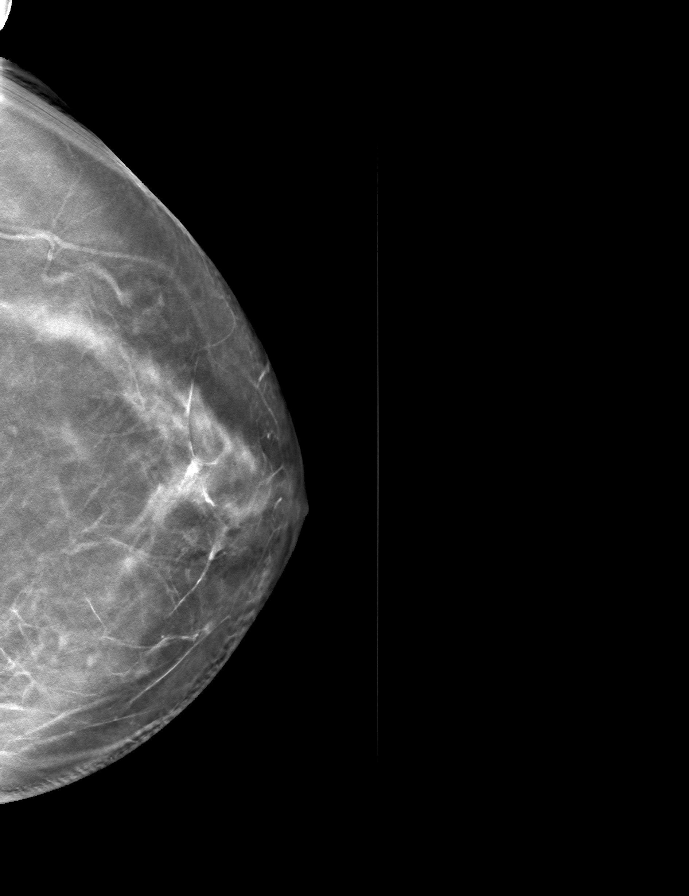

[R CC tomo · tomo slice 39/76.0]
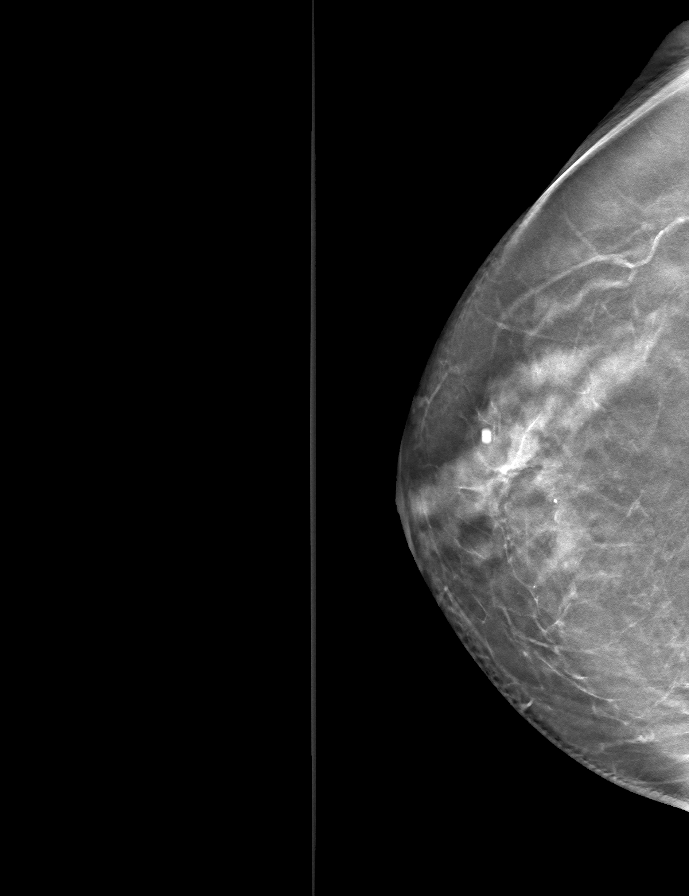

[9 of 24 positions shown; findings below may reference images not displayed]

ACR Breast Density Category c: The breast tissue is heterogeneously
dense, which may obscure small masses.
FINDINGS: There are no findings suspicious for malignancy.
IMPRESSION: No mammographic evidence of malignancy. A result letter of this
screening mammogram will be mailed directly to the patient.

RECOMMENDATION:
Screening mammogram in one year. (Code:Q3-W-BC3)

BI-RADS CATEGORY  1: Negative.

## 2023-06-01 ENCOUNTER — Encounter (INDEPENDENT_AMBULATORY_CARE_PROVIDER_SITE_OTHER): Admitting: Ophthalmology

## 2023-06-01 DIAGNOSIS — H35342 Macular cyst, hole, or pseudohole, left eye: Secondary | ICD-10-CM

## 2023-06-01 DIAGNOSIS — H35033 Hypertensive retinopathy, bilateral: Secondary | ICD-10-CM

## 2023-06-01 DIAGNOSIS — H43811 Vitreous degeneration, right eye: Secondary | ICD-10-CM

## 2023-06-01 DIAGNOSIS — H59031 Cystoid macular edema following cataract surgery, right eye: Secondary | ICD-10-CM | POA: Diagnosis not present

## 2023-06-01 DIAGNOSIS — H35371 Puckering of macula, right eye: Secondary | ICD-10-CM | POA: Diagnosis not present

## 2023-06-01 DIAGNOSIS — I10 Essential (primary) hypertension: Secondary | ICD-10-CM | POA: Diagnosis not present

## 2023-07-29 ENCOUNTER — Encounter (INDEPENDENT_AMBULATORY_CARE_PROVIDER_SITE_OTHER): Admitting: Ophthalmology

## 2023-07-29 DIAGNOSIS — H59031 Cystoid macular edema following cataract surgery, right eye: Secondary | ICD-10-CM | POA: Diagnosis not present

## 2023-07-29 DIAGNOSIS — H43811 Vitreous degeneration, right eye: Secondary | ICD-10-CM | POA: Diagnosis not present

## 2023-07-29 DIAGNOSIS — H35342 Macular cyst, hole, or pseudohole, left eye: Secondary | ICD-10-CM | POA: Diagnosis not present

## 2023-07-29 DIAGNOSIS — I10 Essential (primary) hypertension: Secondary | ICD-10-CM

## 2023-07-29 DIAGNOSIS — H35371 Puckering of macula, right eye: Secondary | ICD-10-CM | POA: Diagnosis not present

## 2023-07-29 DIAGNOSIS — H35033 Hypertensive retinopathy, bilateral: Secondary | ICD-10-CM | POA: Diagnosis not present

## 2023-08-17 ENCOUNTER — Other Ambulatory Visit: Payer: Self-pay | Admitting: Family Medicine

## 2023-08-17 DIAGNOSIS — Z1231 Encounter for screening mammogram for malignant neoplasm of breast: Secondary | ICD-10-CM

## 2023-08-31 ENCOUNTER — Ambulatory Visit (HOSPITAL_BASED_OUTPATIENT_CLINIC_OR_DEPARTMENT_OTHER)
Admission: RE | Admit: 2023-08-31 | Discharge: 2023-08-31 | Disposition: A | Discharge status: Home / Self Care | Source: Ambulatory Visit | Attending: Family Medicine | Admitting: Family Medicine

## 2023-08-31 DIAGNOSIS — M810 Age-related osteoporosis without current pathological fracture: Secondary | ICD-10-CM | POA: Diagnosis not present

## 2023-08-31 DIAGNOSIS — Z780 Asymptomatic menopausal state: Secondary | ICD-10-CM | POA: Diagnosis not present

## 2023-09-01 DIAGNOSIS — M7062 Trochanteric bursitis, left hip: Secondary | ICD-10-CM | POA: Diagnosis not present

## 2023-09-08 ENCOUNTER — Other Ambulatory Visit: Payer: Medicare HMO

## 2023-09-16 ENCOUNTER — Encounter (INDEPENDENT_AMBULATORY_CARE_PROVIDER_SITE_OTHER): Admitting: Ophthalmology

## 2023-09-16 DIAGNOSIS — H59031 Cystoid macular edema following cataract surgery, right eye: Secondary | ICD-10-CM | POA: Diagnosis not present

## 2023-09-16 DIAGNOSIS — H35033 Hypertensive retinopathy, bilateral: Secondary | ICD-10-CM

## 2023-09-16 DIAGNOSIS — I10 Essential (primary) hypertension: Secondary | ICD-10-CM | POA: Diagnosis not present

## 2023-09-16 DIAGNOSIS — H35371 Puckering of macula, right eye: Secondary | ICD-10-CM | POA: Diagnosis not present

## 2023-09-16 DIAGNOSIS — H35342 Macular cyst, hole, or pseudohole, left eye: Secondary | ICD-10-CM

## 2023-09-16 DIAGNOSIS — H43811 Vitreous degeneration, right eye: Secondary | ICD-10-CM | POA: Diagnosis not present

## 2023-09-23 DIAGNOSIS — M720 Palmar fascial fibromatosis [Dupuytren]: Secondary | ICD-10-CM | POA: Diagnosis not present

## 2023-09-23 DIAGNOSIS — M1811 Unilateral primary osteoarthritis of first carpometacarpal joint, right hand: Secondary | ICD-10-CM | POA: Diagnosis not present

## 2023-09-24 ENCOUNTER — Ambulatory Visit

## 2023-09-28 ENCOUNTER — Ambulatory Visit
Admission: RE | Admit: 2023-09-28 | Discharge: 2023-09-28 | Disposition: A | Discharge status: Home / Self Care | Source: Ambulatory Visit | Attending: Family Medicine | Admitting: Family Medicine

## 2023-09-28 DIAGNOSIS — Z1231 Encounter for screening mammogram for malignant neoplasm of breast: Secondary | ICD-10-CM | POA: Diagnosis not present

## 2023-11-24 DIAGNOSIS — M7062 Trochanteric bursitis, left hip: Secondary | ICD-10-CM | POA: Diagnosis not present

## 2023-11-24 DIAGNOSIS — M25552 Pain in left hip: Secondary | ICD-10-CM | POA: Diagnosis not present

## 2023-11-25 ENCOUNTER — Encounter (INDEPENDENT_AMBULATORY_CARE_PROVIDER_SITE_OTHER): Admitting: Ophthalmology

## 2023-11-25 DIAGNOSIS — H43811 Vitreous degeneration, right eye: Secondary | ICD-10-CM | POA: Diagnosis not present

## 2023-11-25 DIAGNOSIS — H59031 Cystoid macular edema following cataract surgery, right eye: Secondary | ICD-10-CM | POA: Diagnosis not present

## 2023-11-25 DIAGNOSIS — I10 Essential (primary) hypertension: Secondary | ICD-10-CM | POA: Diagnosis not present

## 2023-11-25 DIAGNOSIS — H35371 Puckering of macula, right eye: Secondary | ICD-10-CM

## 2023-11-25 DIAGNOSIS — H35033 Hypertensive retinopathy, bilateral: Secondary | ICD-10-CM

## 2024-01-05 ENCOUNTER — Ambulatory Visit (HOSPITAL_COMMUNITY)
Admission: RE | Admit: 2024-01-05 | Discharge: 2024-01-05 | Disposition: A | Discharge status: Home / Self Care | Source: Ambulatory Visit | Attending: Vascular Surgery | Admitting: Vascular Surgery

## 2024-01-05 ENCOUNTER — Other Ambulatory Visit (HOSPITAL_COMMUNITY): Payer: Self-pay | Admitting: Medical

## 2024-01-05 DIAGNOSIS — M79605 Pain in left leg: Secondary | ICD-10-CM | POA: Diagnosis present

## 2024-01-05 DIAGNOSIS — M79604 Pain in right leg: Secondary | ICD-10-CM

## 2024-02-29 ENCOUNTER — Encounter (INDEPENDENT_AMBULATORY_CARE_PROVIDER_SITE_OTHER): Admitting: Ophthalmology
